# Patient Record
Sex: Female | Born: 1965 | Race: White | Hispanic: No | Marital: Married | Smoking: Former smoker
Health system: Southern US, Community
[De-identification: ages and names within clinical notes are randomized; demographics above are authoritative.]

## PROBLEM LIST (undated history)

## (undated) DIAGNOSIS — G709 Myoneural disorder, unspecified: Secondary | ICD-10-CM

## (undated) HISTORY — PX: LUMBAR DISC SURGERY: SHX700

## (undated) HISTORY — PX: ELBOW SURGERY: SHX618

## (undated) HISTORY — PX: ABDOMINOPLASTY: SUR9

## (undated) HISTORY — PX: APPENDECTOMY: SHX54

---

## 2007-09-10 ENCOUNTER — Encounter: Admission: RE | Admit: 2007-09-10 | Discharge: 2007-09-10 | Payer: Self-pay | Admitting: Neurosurgery

## 2007-12-12 ENCOUNTER — Encounter: Admission: RE | Admit: 2007-12-12 | Discharge: 2007-12-12 | Payer: Self-pay | Admitting: Obstetrics and Gynecology

## 2008-02-08 ENCOUNTER — Encounter: Admission: RE | Admit: 2008-02-08 | Discharge: 2008-04-16 | Payer: Self-pay | Admitting: Unknown Physician Specialty

## 2008-07-31 ENCOUNTER — Emergency Department (HOSPITAL_COMMUNITY): Admission: EM | Admit: 2008-07-31 | Discharge: 2008-07-31 | Payer: Self-pay | Admitting: Emergency Medicine

## 2008-12-16 ENCOUNTER — Encounter: Admission: RE | Admit: 2008-12-16 | Discharge: 2008-12-16 | Payer: Self-pay | Admitting: Obstetrics and Gynecology

## 2010-01-30 ENCOUNTER — Encounter: Admission: RE | Admit: 2010-01-30 | Discharge: 2010-01-30 | Payer: Self-pay | Admitting: Obstetrics and Gynecology

## 2010-08-13 ENCOUNTER — Other Ambulatory Visit: Payer: Self-pay | Admitting: Family Medicine

## 2010-08-13 ENCOUNTER — Ambulatory Visit
Admission: RE | Admit: 2010-08-13 | Discharge: 2010-08-13 | Disposition: A | Discharge status: Home / Self Care | Payer: Private Health Insurance - Indemnity | Source: Ambulatory Visit | Attending: Family Medicine | Admitting: Family Medicine

## 2010-08-13 MED ORDER — IOHEXOL 300 MG/ML  SOLN
100.0000 mL | Freq: Once | INTRAMUSCULAR | Status: AC | PRN
  Administered 2010-08-13: 100 mL via INTRAVENOUS

## 2010-08-14 ENCOUNTER — Other Ambulatory Visit: Payer: Self-pay | Admitting: Internal Medicine

## 2010-08-14 DIAGNOSIS — E039 Hypothyroidism, unspecified: Secondary | ICD-10-CM

## 2010-08-18 ENCOUNTER — Other Ambulatory Visit: Payer: Private Health Insurance - Indemnity

## 2010-08-20 ENCOUNTER — Other Ambulatory Visit: Payer: Self-pay | Admitting: Internal Medicine

## 2010-08-20 DIAGNOSIS — E282 Polycystic ovarian syndrome: Secondary | ICD-10-CM

## 2010-08-21 ENCOUNTER — Ambulatory Visit
Admission: RE | Admit: 2010-08-21 | Discharge: 2010-08-21 | Disposition: A | Discharge status: Home / Self Care | Payer: Private Health Insurance - Indemnity | Source: Ambulatory Visit | Attending: Internal Medicine | Admitting: Internal Medicine

## 2010-08-21 ENCOUNTER — Other Ambulatory Visit: Payer: Private Health Insurance - Indemnity

## 2010-08-21 DIAGNOSIS — E039 Hypothyroidism, unspecified: Secondary | ICD-10-CM

## 2010-08-25 ENCOUNTER — Inpatient Hospital Stay: Admission: RE | Admit: 2010-08-25 | Payer: Private Health Insurance - Indemnity | Source: Ambulatory Visit

## 2011-05-20 ENCOUNTER — Ambulatory Visit (INDEPENDENT_AMBULATORY_CARE_PROVIDER_SITE_OTHER): Payer: Private Health Insurance - Indemnity | Admitting: Sports Medicine

## 2011-05-20 VITALS — BP 100/60 | Ht 65.0 in | Wt 130.0 lb

## 2011-05-20 DIAGNOSIS — M7710 Lateral epicondylitis, unspecified elbow: Secondary | ICD-10-CM

## 2011-05-20 DIAGNOSIS — M7711 Lateral epicondylitis, right elbow: Secondary | ICD-10-CM | POA: Insufficient documentation

## 2011-05-20 DIAGNOSIS — M25529 Pain in unspecified elbow: Secondary | ICD-10-CM

## 2011-05-20 DIAGNOSIS — M25521 Pain in right elbow: Secondary | ICD-10-CM

## 2011-05-20 MED ORDER — NITROGLYCERIN 0.2 MG/HR TD PT24: MEDICATED_PATCH | TRANSDERMAL | Status: DC

## 2011-05-20 NOTE — Assessment & Plan Note (Signed)
use over-the-counter Aleve or Advil along with topical medicines as desired  Ice massage if needed  Heat if desired  Compression sleeve in the office gave her some real comfort and so we gave her one of those to use

## 2011-05-20 NOTE — Assessment & Plan Note (Signed)
Begin rehabilitation exercises as described  Start on nitroglycerin protocol and repeat her scan in one month

## 2011-05-20 NOTE — Patient Instructions (Signed)
Please do suggested exercises for elbow 3 sets of 15 daily  Start using 1/4 patch of nitroglycerin daily.    Please follow up in 1 month for rescan of the elbow   OK to do other exercises that don't hurt  Use compression sleeve for activity

## 2011-05-20 NOTE — Progress Notes (Signed)
  Subjective:    Patient ID: Stacy Terrell, female    DOB: 10-May-1966, 45 y.o.   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 Hurt RT elbow - prob on Rowing machine in Feb Back to tennis in [name] Could not play - changed strings/ went to 2 hand backhand  Dr Stacy Terrell in summer - June Had PT - 10 sessions 2 steroid shots - good pain relief - did not last though  Dried needling has not helped yet after 3 sessions  Now hurts all the time and even at night  Uses std braces - has tried 3  Tried all the creams including arnica and topicals/ oral NSAIDs - not much help either   Review of Systems Hx of fracture to left radial head    Objective:   Physical Exam  NAD  Has full ROM of RT and left elbow  Pain on resistance of wrist or finger extension  Cannot do book  TTP lat epidcondyle  MSK ultrasound Chest soft tissue disruption around the lateral epicondyle as evidenced by hypoechoic change. There is a small calcification at the tip of the epicondyle There is minor calcification in the extensor muscle tendon and an area of edematous change and possible separation from the bony attachment This is noted on both the longitudinal and transverse scans There is also marked increase in Doppler activity including neo-vessels      Assessment & Plan:

## 2011-06-17 ENCOUNTER — Encounter: Payer: Self-pay | Admitting: Sports Medicine

## 2011-06-17 ENCOUNTER — Ambulatory Visit (INDEPENDENT_AMBULATORY_CARE_PROVIDER_SITE_OTHER): Payer: Private Health Insurance - Indemnity | Admitting: Sports Medicine

## 2011-06-17 VITALS — BP 121/76 | HR 76

## 2011-06-17 DIAGNOSIS — M25521 Pain in right elbow: Secondary | ICD-10-CM

## 2011-06-17 DIAGNOSIS — M25529 Pain in unspecified elbow: Secondary | ICD-10-CM

## 2011-06-17 DIAGNOSIS — IMO0002 Reserved for concepts with insufficient information to code with codable children: Secondary | ICD-10-CM

## 2011-06-17 DIAGNOSIS — S86899A Other injury of other muscle(s) and tendon(s) at lower leg level, unspecified leg, initial encounter: Secondary | ICD-10-CM

## 2011-06-17 NOTE — Patient Instructions (Signed)
Keep custom orthotics. Abductors exercises. Heel excentric exercises( heel drops) Use the calf sleeves. Ice massage after running F/U 4-6 weeks

## 2011-06-17 NOTE — Progress Notes (Signed)
Subjective:    Patient ID: Stacy Terrell, female    DOB: 1965-12-14, 45 y.o.   MRN: [id number]  HPI  Stacy Terrell is coming today to follow up on her right elbow lateral epicondylitis. She has been using the nitroglycerin patch and doing the lateral epicondylitis stretches for about 4 weeks. She stated that she is 20% better, to state that her pain is much milder, one in intensity, on a none, is not a constant pain, no numbness or tingling, the pain is improved by resting and with the stretches.  She is also complaining of bilateral shin splints worse on the right side. She has been having shin splints for the last 2 years. She was seen by the foot and ankle specialist 2 years ago and an MRI of the right leg was done which apparently was negative for any stress fracture. She stated that her pain in both legs is constant worse on the right side 5/10 intensity, and the left side to 10 intensity. She has pain in her shins walking and running when she hit the 6 miles marks. No numbness no tingling the pain subsides with rest. She running every other day alternating with cross fit training She she had orthotics made 2 years ago which are rigid in our opinion. She has never done any calf stretching or strengthening exercises she has never tried to use any calf sleeves. She is not trying cryotherapy after activities either. She also has been diagnosed with a right foot Morton's neuroma reasonable she has metatarsal pads on both of her orthotics.  Patient Active Problem List  Diagnoses  . Elbow pain, right  . Lateral epicondylitis of right elbow   Current Outpatient Prescriptions on File Prior to Visit  Medication Sig Dispense Refill  . Calcium Carbonate-Vit D-Min (CALCIUM 1200 PO) Take 1 tablet by mouth daily.        . Iodine, Kelp, (KELP PO) Take 200 mg by mouth daily.        . Linoleic Acid Conjugated (CLA PO) Take 1,600 mg by mouth daily.        Marland Kitchen LOESTRIN 24 FE 1-20 MG-MCG tablet Take 1 tablet by  mouth daily.      . nitroGLYCERIN (NITRODUR - DOSED IN MG/24 HR) 0.2 mg/hr Apply 1/4 patch to affected area daily.  Change patch every 24 hours as directed.  30 patch  1  . spironolactone (ALDACTONE) 25 MG tablet Take 50 mg by mouth daily.      Marland Kitchen SYNTHROID 175 MCG tablet Take 175 mcg by mouth daily.       Allergies  Allergen Reactions  . Aspirin Nausea Only     Review of Systems  Constitutional: Negative for fever, chills, diaphoresis and fatigue.  Musculoskeletal: Negative for back pain, joint swelling and gait problem.  Neurological: Negative for tremors, weakness and numbness.       Objective:   Physical Exam  Constitutional: She appears well-developed and well-nourished.       BP 121/76  Pulse 76   Pulmonary/Chest: Effort normal.  Musculoskeletal:       Right elbow with intact skin. No swelling, no hematoma. Full Range of motion. Tender to palpation over the lateral epicondyle. Wrist extension against resistance reproduce lateral elbow pain. No instability. Mild click with extension Sensation intact distally.  Legs with tenderness to palpation in the medial portion of the distal third of the leg, and attachment of the gastroc and the Tibia bilateral worse in the right side compared  to the left. She is able to hop 10 times in both legs with pain at the end of the hoping test. Wound range of motion in the knee and ankle joint bilateral Neurovascularly intact Leg length discrepancy being the right leg shorter by 2 cm. Weak hip abductors bilateral . Gait independent without a limp. A feet with mild flat arch bilateral.  Neurological: She is alert.  Skin: Skin is warm. No rash noted. No erythema. No pallor.  Psychiatric: She has a normal mood and affect. Her behavior is normal.    MSK U/S of both tibias: Negative for stress fracture, negative for increasing vasculature, negative for periosteal reaction.      Assessment & Plan:   1. Elbow pain, right   2. Shin  splints    Keep temporary orthotics with sports insoles, metatarsal pads, and small scaphoid pads. Abductors exercises. Heel excentric exercises( heel drops) Use the calf sleeves. Ice massage after running F/U 4-6 weeks we will proceed with custom orthotics in that visit if she benefits from the temporary ones.

## 2011-06-17 NOTE — Progress Notes (Deleted)
Patient ID: Stacy Terrell, female   DOB: Jadine 24, 1967, 45 y.o.   MRN: [id number]

## 2011-07-21 ENCOUNTER — Ambulatory Visit (INDEPENDENT_AMBULATORY_CARE_PROVIDER_SITE_OTHER): Payer: Private Health Insurance - Indemnity | Admitting: Sports Medicine

## 2011-07-21 VITALS — BP 120/60

## 2011-07-21 DIAGNOSIS — M79609 Pain in unspecified limb: Secondary | ICD-10-CM

## 2011-07-21 DIAGNOSIS — M25529 Pain in unspecified elbow: Secondary | ICD-10-CM

## 2011-07-21 DIAGNOSIS — M7710 Lateral epicondylitis, unspecified elbow: Secondary | ICD-10-CM

## 2011-07-21 DIAGNOSIS — M79669 Pain in unspecified lower leg: Secondary | ICD-10-CM

## 2011-07-21 DIAGNOSIS — M25521 Pain in right elbow: Secondary | ICD-10-CM

## 2011-07-21 DIAGNOSIS — M7711 Lateral epicondylitis, right elbow: Secondary | ICD-10-CM

## 2011-07-21 NOTE — Assessment & Plan Note (Addendum)
This has been going on a long time but unlikely to be stress fracture as she can run long distance on it.  May be related to biomechanics and rigid orthotics.  Will try custom cushioned orthotics that are more appropriate for running.   Patient was fitted for a : standard, cushioned, semi-rigid orthotic. The orthotic was heated and afterward the patient stood on the orthotic blank positioned on the orthotic stand. The patient was positioned in subtalar neutral position and 10 degrees of ankle dorsiflexion in a weight bearing stance. After completion of molding, a stable base was applied to the orthotic blank. The blank was ground to a stable position for weight bearing. Size: 8 red cambray Base: blue EVA Posting: RT heel blue foarm Additional orthotic padding: MT soft pads on plantar surface as a test as opposed to typical MT pads Time 40 mins  She may use the rigid orthotics for standard walking during the day as these do help her neuroma. However following because of the persistent shin problems on her trunk discussion orthotic. We will need to recheck in a couple of months to see if the padding strategy we used will help keep pressure off the forefoot.

## 2011-07-21 NOTE — Assessment & Plan Note (Addendum)
Some improvement symptomatically.  Will continue Nitro patches and have advised increasing weight up to 3 lbs for exercises.  Advised not to try tennis yet.  Follow up in 2 months.   We will repeat her ultrasound to check for neovascular activity change which she had on her last scan on return visit

## 2011-07-21 NOTE — Assessment & Plan Note (Signed)
Pain is now at a reasonable level and we will not add anything but she is to continue her nitroglycerin

## 2011-07-21 NOTE — Progress Notes (Signed)
  Subjective:    Patient ID: Stacy Terrell, female    DOB: Apr 29, 1966, 46 y.o.   MRN: [id number]  HPI  Stacy Terrell come sin for follow up of her lateral epicondylitis and her shin pain.   She is doing exercises for her elbow with one pound weights.  She is using 1/4 of a nitro patch daily.  She says the pain is better, no longer constant, but will still hurt if she picks up something heavy with her left and (like a gallon of milk).  She has not tried to play tennis yet.   Her shins are still very painful, and she was not able to wear the sports insoles she got last time because they gave her blisters.  She ran 6.9 miles on Sunday, and the pain is not so bad while running, but she says it hurts badly for two days after.  She has rigid orthotics made by her podiatrist for the neuroma in her right foot.  These help tremendously with the neuroma, but do not help with shin pain.   Review of Systems Pertinent items in HPI.     Objective:   Physical Exam BP 120/60 General appearance: alert, cooperative and no distress MSK:  Left elbow with TTP on lateral epicondyle. Pt has pain with supination of left hand Pain with extension of left hand and fingers Pain with lifting heavy book with arm extended  Patient has normal strength and of hip adductors and abductors Right shin tender to palpation over area between mid and lower tibia  Gait: when running, right foot crosses midline in backswing of step Orthotics made with correction for unequal leg length Gait abnormality improved with orthotic.         Assessment & Plan:

## 2011-08-24 ENCOUNTER — Ambulatory Visit (INDEPENDENT_AMBULATORY_CARE_PROVIDER_SITE_OTHER): Payer: Private Health Insurance - Indemnity | Admitting: Sports Medicine

## 2011-08-24 VITALS — BP 110/70

## 2011-08-24 DIAGNOSIS — M79669 Pain in unspecified lower leg: Secondary | ICD-10-CM

## 2011-08-24 DIAGNOSIS — M25569 Pain in unspecified knee: Secondary | ICD-10-CM

## 2011-08-24 DIAGNOSIS — M79609 Pain in unspecified limb: Secondary | ICD-10-CM

## 2011-08-24 DIAGNOSIS — M25561 Pain in right knee: Secondary | ICD-10-CM | POA: Insufficient documentation

## 2011-08-24 DIAGNOSIS — M7711 Lateral epicondylitis, right elbow: Secondary | ICD-10-CM

## 2011-08-24 DIAGNOSIS — M7710 Lateral epicondylitis, unspecified elbow: Secondary | ICD-10-CM

## 2011-08-24 NOTE — Progress Notes (Signed)
  Subjective:    Patient ID: Stacy Terrell, female    DOB: 02/13/1966, 46 y.o.   MRN: [id number]  HPI  Pt presents to clinic for f/u of rt lat elbow pain which is 70% improved.   Using nitroglycerin patches daily.  Training for 1/2 marathon - 20-25 mpw.  Has rt knee and shin pain.   Right knee pain since to have increased some since we placed her in a cushioned orthotics with a lift on the right side. However her shin pain has decreased significantly with the additional cushioned. In addition the neuroma pain she has bilaterally is much less with the padding. The right knee pain is along the patellar tendon and the remainder of the knee feels stable.  Review of Systems     Objective:   Physical Exam No acute distress  Rt elbow exam: Slight pain with resisted finger extension Mild pain with book test after lifting No ttp now  rt knee exam: Full flexion and extension Slight crepitation  Neg mcmurray's  Good quad strength Weak hip abduction on rt  Lt hip abduction stronger than rt  MSK ultrasound The right elbow looks significantly improved on scanning. There is almost no Doppler activity now. The areas of hypoechoic change of essentially resolved. There is some scattered calcification noted.  The right patellar tendon looks normal with no hypoechoic change or abnormal fluid around the tendon itself.     Assessment & Plan:

## 2011-08-24 NOTE — Assessment & Plan Note (Signed)
She has had dramatic improvement in her right elbow pain. Scanning also looks much better. We will continue the nitroglycerin and recheck her in 2 months. She can start trying some easy tennis activities. Continue the elbow exercises with low weight since more than 2 pounds is painful to her wrist.

## 2011-08-24 NOTE — Assessment & Plan Note (Signed)
This is much improved after addition of custom orthotics that are cushioned

## 2011-08-24 NOTE — Assessment & Plan Note (Signed)
We removed the lift on the right orthotic. She will work on her hip strength which is significantly weak and may be affecting this. She will try a patellar strap until seeing [place] in 2 months.

## 2011-08-24 NOTE — Patient Instructions (Signed)
Please continue using 1/4 nitroglycerin patch on right elbow daily   You can start hitting tennis strokes for the next 2 weeks, if no problems with this it is ok to start playing tennis  Do side leg lifts to work on hip strength  Please follow up in 2 months  Thank you for seeing [place] today!

## 2011-10-25 ENCOUNTER — Ambulatory Visit: Payer: Private Health Insurance - Indemnity | Admitting: Sports Medicine

## 2011-10-27 ENCOUNTER — Ambulatory Visit: Payer: Private Health Insurance - Indemnity | Admitting: Sports Medicine

## 2011-11-11 ENCOUNTER — Ambulatory Visit: Payer: Private Health Insurance - Indemnity | Admitting: Sports Medicine

## 2011-11-24 ENCOUNTER — Encounter: Payer: Self-pay | Admitting: Sports Medicine

## 2011-11-24 ENCOUNTER — Ambulatory Visit (INDEPENDENT_AMBULATORY_CARE_PROVIDER_SITE_OTHER): Payer: Private Health Insurance - Indemnity | Admitting: Sports Medicine

## 2011-11-24 VITALS — BP 110/73 | HR 76 | Ht 65.0 in | Wt 130.0 lb

## 2011-11-24 DIAGNOSIS — M7711 Lateral epicondylitis, right elbow: Secondary | ICD-10-CM

## 2011-11-24 DIAGNOSIS — M25529 Pain in unspecified elbow: Secondary | ICD-10-CM

## 2011-11-24 DIAGNOSIS — M7710 Lateral epicondylitis, unspecified elbow: Secondary | ICD-10-CM

## 2011-11-24 DIAGNOSIS — M79609 Pain in unspecified limb: Secondary | ICD-10-CM

## 2011-11-24 DIAGNOSIS — M25521 Pain in right elbow: Secondary | ICD-10-CM

## 2011-11-24 DIAGNOSIS — M25559 Pain in unspecified hip: Secondary | ICD-10-CM | POA: Insufficient documentation

## 2011-11-24 DIAGNOSIS — M79669 Pain in unspecified lower leg: Secondary | ICD-10-CM

## 2011-11-24 DIAGNOSIS — M25552 Pain in left hip: Secondary | ICD-10-CM

## 2011-11-24 MED ORDER — MELOXICAM 15 MG PO TABS: 15.0000 mg | ORAL_TABLET | Freq: Every day | ORAL | Status: DC

## 2011-11-24 MED ORDER — AMITRIPTYLINE HCL 25 MG PO TABS: 25.0000 mg | ORAL_TABLET | Freq: Every day | ORAL | Status: DC

## 2011-11-24 NOTE — Assessment & Plan Note (Signed)
Clinically this has resolved. °

## 2011-11-24 NOTE — Assessment & Plan Note (Signed)
Likely secondary to her degenerative disc disease. Concern for flare of nerve pain. Will try her on amitriptyline 25 mg each bedtime as well as Mobic daily to help with pain and inflammation. Will start hip exercises to continue strength building.

## 2011-11-24 NOTE — Assessment & Plan Note (Signed)
Continued pain in the shin this may be due to some imbalance in strike pattern with her hip weakness. We'll work on hip and then have her come back in 4-6 weeks to see about pain in the shins.

## 2011-11-24 NOTE — Patient Instructions (Signed)
Please take the meloxicam instead of the Aleve for now If you have stomach irritation with it, please call us and we will change your medicine Take the meloxicam with food  Please take the amitriptyline each night Let us know if you're too tired in the morning This should help with the nerve  Please come back and see [place] in 4-6 weeks to see how you're doing

## 2011-11-24 NOTE — Progress Notes (Signed)
Patient ID: Stacy Terrell, female   DOB: 11/09/1965, 46 y.o.   MRN: [id number] History of present illness: Right elbow-patient notes that right elbow pain is completely gone. She continues to wear a compression sleeve which helps her. She has stopped the nitroglycerin. She is playing tennis several times a week. She has learned how to use both hands for her forehand swing which helps with pressure.  Shin pain-right greater than left. She continues to ice and use compression sleeves. She has used the orthotics which are comfortable with not very helpful. She continues to run 20-24 miles per week next several the last 2 weeks her she hasn't been running. She notes that overall her shin splints are the same and not improved  Hip pain-patient stopped running because on her last run 2 weeks ago she noticed that afterwards she had a left-sided hip pain. This has run course that is not  particular hilly, she did not have new shoes, it was about 7 miles which is a typical run for her. She says that the pain started about 2 hours after run. She has her in Motrin without much relief and has tried Aleve with some mild relief.  Patient notes that she has a history of degenerative disc disease and had a back surgery several years ago. She feels that this has flared some and she has some numbness going down the left leg.  Physical exam: Hip- Full range of motion bilaterally without pain Negative Faber bilaterally Strength testing--patient is weaker on the left side in the hip flexor, sartorius, hip abductor, hip adductor Normal alignment of hips, knees, feet  Feet-posterior tibialis function is good Foot alignment is good Mild loss of transverse arch on left

## 2011-11-24 NOTE — Assessment & Plan Note (Signed)
Improved. Patient to continue her modified tennis play

## 2011-12-02 ENCOUNTER — Telehealth: Payer: Self-pay | Admitting: *Deleted

## 2011-12-02 NOTE — Telephone Encounter (Signed)
Message copied by Mora Bellman on Thu Dec 02, 2011  2:14 PM ------      Message from: CERESI, [place] L      Created: Thu Dec 02, 2011  8:33 AM      Regarding: phone message      Contact: [phone]       Pt called and states she is still having hip pain, she is taking 3 aleve a day that isn't helping, she wants to know if Dr. Darrick Penna thinks a cortisone patch would help her?

## 2011-12-02 NOTE — Telephone Encounter (Signed)
Called pt, left VM to return my call

## 2011-12-07 NOTE — Telephone Encounter (Signed)
Per Dr. Darrick Penna pt's hip pain is referred from her back, and prednisone does not generally help this if the back problem has persisted longer than 1 yr. Pt is taking amitriptyline 25 mg qhs with no problems x2 weeks.  Dr. Darrick Penna advised her to increase to amitriptyline 50 mg qhs, and ok to take aleve 2 tabs bid.  Pt agreeable, will let us know how she does with this.

## 2011-12-07 NOTE — Telephone Encounter (Signed)
Pt states hip is still as painful as it had been previously.  Meloxicam not helpful at all, aleve slightly better.  Pt wondering if prednisone pack would be a good option?

## 2011-12-17 ENCOUNTER — Other Ambulatory Visit: Payer: Self-pay | Admitting: Neurosurgery

## 2011-12-17 DIAGNOSIS — M542 Cervicalgia: Secondary | ICD-10-CM

## 2011-12-17 DIAGNOSIS — M545 Low back pain: Secondary | ICD-10-CM

## 2011-12-22 ENCOUNTER — Ambulatory Visit
Admission: RE | Admit: 2011-12-22 | Discharge: 2011-12-22 | Disposition: A | Discharge status: Home / Self Care | Payer: Private Health Insurance - Indemnity | Source: Ambulatory Visit | Attending: Neurosurgery | Admitting: Neurosurgery

## 2011-12-22 DIAGNOSIS — M545 Low back pain: Secondary | ICD-10-CM

## 2011-12-22 DIAGNOSIS — M542 Cervicalgia: Secondary | ICD-10-CM

## 2011-12-22 MED ORDER — GADOBENATE DIMEGLUMINE 529 MG/ML IV SOLN
12.0000 mL | Freq: Once | INTRAVENOUS | Status: AC | PRN
  Administered 2011-12-22: 12 mL via INTRAVENOUS

## 2011-12-23 ENCOUNTER — Ambulatory Visit: Payer: Private Health Insurance - Indemnity | Admitting: Sports Medicine

## 2012-01-19 ENCOUNTER — Ambulatory Visit (INDEPENDENT_AMBULATORY_CARE_PROVIDER_SITE_OTHER): Payer: Private Health Insurance - Indemnity | Admitting: Sports Medicine

## 2012-01-19 VITALS — BP 120/84

## 2012-01-19 DIAGNOSIS — M25559 Pain in unspecified hip: Secondary | ICD-10-CM

## 2012-01-19 MED ORDER — DICLOFENAC SODIUM 75 MG PO TBEC: 75.0000 mg | DELAYED_RELEASE_TABLET | Freq: Two times a day (BID) | ORAL | Status: DC

## 2012-01-19 MED ORDER — AMITRIPTYLINE HCL 25 MG PO TABS: 50.0000 mg | ORAL_TABLET | Freq: Every day | ORAL | Status: DC

## 2012-01-19 MED ORDER — PREDNISONE 50 MG PO TABS: ORAL_TABLET | ORAL | Status: AC

## 2012-01-19 NOTE — Patient Instructions (Addendum)
Very nice to meet you. I do think your hip pain is coming from your back. I think we should try to decrease any inflammation we can with a prednisone burst. Take 50 mg daily for the next 5 days. Because the meloxicam has not helped we're going to try a new anti-inflammatory that he take twice a day. Stop the Aleve. We will increase her amitriptyline to 50 mg at night. Use the muscle relaxer as needed. Continue the exercises and stretching. If you notice anything that seems to be off kilt please come back for manipulation but I did not see anything today that that would be an improvement. I think with your pain at this time he should consider a potential epidural steroid injection. Please come back if there is anything else we can do.

## 2012-01-19 NOTE — Assessment & Plan Note (Addendum)
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 Patient's MRI does show a potential nerve impingement that corresponds with patient's clinical symptoms. Attempted to find any malalignment that could be corrected with manipulation but there was none today. We'll attempt to treat with prednisone burst to try decrease in inflammation, also will try another type of anti-inflammatory, increase patient's amitriptyline to 50 mg at night Encourage patient to build up hip abductor muscle strength Patient will followup at neuro surgeon for the potential for epidural steroid injections. (Dr Gerlene Fee) Patient will return here if at any point thinks manipulative medicine could be beneficial.  We will follow up other probs as needed - those are doing better

## 2012-01-19 NOTE — Progress Notes (Signed)
Patient ID: Stacy Terrell, female   DOB: 1965-07-29, 46 y.o.   MRN: [id number]   Hip pain- patient is here for followup. This is left-sided and since that time we've seen her she hasn't been wearing her insoles, in addition to taking amitriptyline on a nightly basis, as well as meloxicam with no improvement. Patient states that the pain seems to be in the anterior of the deep part of her hip with some radiation down to the bottom of her foot. Patient does have a past medical history significant for herniated disc repair of L5-S1. Patient does have some postoperative changes at L5-S1 with minimal scarring around the left side of the thecal sac on MRI that was taken on December 17, 2011. Patient denies any weakness but has been going to physical therapy where the therapist told her that she needs to stop all activities which is not option for the patient.    Physical exam: Hip- Full range of motion bilaterally without pain Negative Faber bilaterally Strength testing--patient is weaker on the left side in the hip flexor, sartorius, hip abductor, hip adductor 3+ out of 5 strength compared to 4/5 strength on the opposite side. Normal alignment of hips, knees, feet Deep tendon reflexes 2+ and symmetric. Neurovascularly intact distally  Feet-posterior tibialis function is good Foot alignment is good Mild loss of transverse arch on left

## 2012-02-15 ENCOUNTER — Ambulatory Visit (INDEPENDENT_AMBULATORY_CARE_PROVIDER_SITE_OTHER): Payer: Private Health Insurance - Indemnity | Admitting: Family Medicine

## 2012-02-15 VITALS — BP 100/60

## 2012-02-15 DIAGNOSIS — M999 Biomechanical lesion, unspecified: Secondary | ICD-10-CM | POA: Insufficient documentation

## 2012-02-15 DIAGNOSIS — M25559 Pain in unspecified hip: Secondary | ICD-10-CM

## 2012-02-15 MED ORDER — AMITRIPTYLINE HCL 25 MG PO TABS: 50.0000 mg | ORAL_TABLET | Freq: Every day | ORAL | Status: DC

## 2012-02-15 MED ORDER — CYCLOBENZAPRINE HCL 10 MG PO TABS: 10.0000 mg | ORAL_TABLET | ORAL | Status: DC | PRN

## 2012-02-15 NOTE — Assessment & Plan Note (Signed)
Decision today to treat with OMT was based on Physical Exam  After verbal consent patient was treated with HVLA and ME techniques in sacrum and illium areas  Patient tolerated the procedure well with improvement in symptoms  Patient given exercises, stretches and lifestyle modifications  See medications in patient instructions if given  Patient will follow up in 3-6 weeks

## 2012-02-15 NOTE — Assessment & Plan Note (Signed)
Patient's hip pain improves somewhat with her prednisone burst as well as exercising but still not at the point where she is satisfied. Patient has MR results that show that there is a potential for nerve impingement causing her symptoms. I do agree that an epidural injection could be of benefit in this patient. That said patient did have osteopathic manipulation done today and that she had some significant improvement. Patient will probably followup again in 3-6 weeks for further evaluation and treatment. Patient given exercises and stretches and encouraged to do them on a daily basis.

## 2012-02-15 NOTE — Patient Instructions (Signed)
I think it is a good idea to get the injection. Please come back if you need any other manipulation.  Usually that sometimes is between 3-6 weeks.

## 2012-02-15 NOTE — Progress Notes (Signed)
Patient ID: Stacy Terrell, female   DOB: [birth year], 46 y.o.   MRN: [id number]  Patient is here for followup of her hip pain. Patient at last visit was given a prednisone burst as well as increase her amitriptyline. Patient states that this has helped somewhat. Patient states though over the course of the last 3 or 4 days she started having worsening pain in her left hip again. Patient has already made her an appointment to have her second epidural injection. Patient still states that she has some radiation of the pain down to her left leg as well as her left foot sometimes. Patient also feels like she is weak on the side of the body. Patient does still run 3.2 miles and can complete this but does have significant pain afterwards. Patient is also been trying to do the exercises and stretching at home with minimal improvement.     Patient does have a past medical history significant for herniated disc repair of L5-S1. Patient does have some postoperative changes at L5-S1 with minimal scarring around the left side of the thecal sac on MRI that was taken on December 17, 2011.      Physical exam: Hip- Full range of motion bilaterally without pain Negative Faber bilaterally Strength testing--patient is weaker on the left side in the hip flexor, sartorius, hip abductor, hip adductor 3+ out of 5 strength compared to 4/5 strength on the opposite side. Normal alignment of hips, knees, feet Deep tendon reflexes 2+ and symmetric. Neurovascularly intact distally  OMT Physical Exam  Standing flexion  left Seated Flexion right Cervical  Not examined  Thoracic Not examined Lumbar L2-3 flexed rotated and side bent right.  Sacrum Right on right   Illium  Left anterior illium

## 2012-02-21 ENCOUNTER — Other Ambulatory Visit: Payer: Self-pay | Admitting: Obstetrics and Gynecology

## 2012-02-21 DIAGNOSIS — Z1231 Encounter for screening mammogram for malignant neoplasm of breast: Secondary | ICD-10-CM

## 2012-03-07 ENCOUNTER — Ambulatory Visit (INDEPENDENT_AMBULATORY_CARE_PROVIDER_SITE_OTHER): Payer: Private Health Insurance - Indemnity | Admitting: Family Medicine

## 2012-03-07 VITALS — BP 114/70

## 2012-03-07 DIAGNOSIS — M999 Biomechanical lesion, unspecified: Secondary | ICD-10-CM

## 2012-03-07 NOTE — Progress Notes (Signed)
Patient ID: Stacy Terrell, female   DOB: [birth year], 46 y.o.   MRN: [id number]  Patient is here for followup of her hip pain. Patient states she has been doing very well overall but still has the chronic back pain and radiation of the hip.  Patient is coming here for manipulation.  Patient is still training for a triathalon with her two different triathalon coaches.  Patient has had epidural injections before for her back and will be having it again 2 days after the marathon on Sept 16th. Patient still states that she has some radiation of the pain down to her left leg as well as her left foot sometimes. Patient does still run 3.2 miles and can complete this but does have significant pain afterwards. Patient is also been trying to do the exercises and stretching at home with minimal improvement.     Patient does have a past medical history significant for herniated disc repair of L5-S1. Patient does have some postoperative changes at L5-S1 with minimal scarring around the left side of the thecal sac on MRI that was taken on December 17, 2011.    Physical exam: Filed Vitals:   03/07/12 1629  BP: 114/70  Gen NAD A&Ox3 mood normal healthy female  Hip- Full range of motion bilaterally without pain Negative Faber bilaterally Strength testing--patient is weaker on the left side in the hip flexor, sartorius, hip abductor, hip adductor 3+ out of 5 strength compared to 4/5 strength on the opposite side. Normal alignment of hips, knees, feet Deep tendon reflexes 2+ and symmetric. Neurovascularly intact distally  OMT Physical Exam  Standing flexion  left Seated Flexion right Cervical  Not examined  Thoracic T5 E RS right Lumbar L2-3 flexed rotated and side bent right.  Sacrum Right on right   Illium  Left anterior illium

## 2012-03-07 NOTE — Assessment & Plan Note (Signed)
Decision today to treat with OMT was based on Physical Exam  After verbal consent patient was treated with HVLA ME techniques in lumbar and sacral areas  Patient tolerated the procedure well with improvement in symptoms  Patient given exercises, stretches and lifestyle modifications  See medications in patient instructions if given  Patient will follow up in 4 weeks

## 2012-04-18 ENCOUNTER — Ambulatory Visit: Payer: Private Health Insurance - Indemnity | Admitting: Family Medicine

## 2012-05-09 ENCOUNTER — Ambulatory Visit (INDEPENDENT_AMBULATORY_CARE_PROVIDER_SITE_OTHER): Payer: Private Health Insurance - Indemnity | Admitting: Family Medicine

## 2012-05-09 ENCOUNTER — Encounter: Payer: Self-pay | Admitting: Family Medicine

## 2012-05-09 VITALS — BP 117/82 | HR 76

## 2012-05-09 DIAGNOSIS — M9981 Other biomechanical lesions of cervical region: Secondary | ICD-10-CM

## 2012-05-09 DIAGNOSIS — M999 Biomechanical lesion, unspecified: Secondary | ICD-10-CM | POA: Insufficient documentation

## 2012-05-09 NOTE — Assessment & Plan Note (Signed)
Decision today to treat with OMT was based on Physical Exam  After verbal consent patient was treated with ME and articular techniques in cervical and thoracic areas  Patient tolerated the procedure well with improvement in symptoms  Patient given exercises, stretches and lifestyle modifications  See medications in patient instructions if given  Patient will follow up in 4-6 weeks

## 2012-05-09 NOTE — Progress Notes (Signed)
Subjective: Patient's actually returns today to tell me that her low back pain has completely resolved after 2 epidural injections. She is feeling much better at this time able to do all activities without any back pain. Patient continues to do the exercises that she was shown previously. Patient feels that she does not like any manipulation for this today.  Patient though does have a history of chronic cervical neck pain. Patient is having more recently and likely she says she's noticed anymore and other back does not hurt as much. Patient does have a cervical neck MRIback in June 2013 that showed stable left-sided spurring changes and a shallow disc protrusion at C4-5 with mild left foraminal stenosis. She also had degenerative disc disease at C5-C6. Patient states that the pain is mostly at the base of the neck on the right side. Patient denies any radiation but did have a history of numbness in the left upper tremor he previously. Patient denies any weakness any fevers or chills any dizziness or loss of strength of the upper extremities.  Objective: Blood pressure 117/82, pulse 76, last menstrual period 05/03/2012. General: No apparent distress alert and oriented x3 mood and affect normal OMT Physical Exam  Cervical  C3 RS left Elevated first rib on right with T1 rotated and side the right  Thoracic As above T5 extended rotated and side bent right  Lumbar Neutral Sacrum neutral Illium neutral

## 2012-06-20 ENCOUNTER — Ambulatory Visit (INDEPENDENT_AMBULATORY_CARE_PROVIDER_SITE_OTHER): Payer: Private Health Insurance - Indemnity | Admitting: Family Medicine

## 2012-06-20 VITALS — BP 110/70 | Ht 65.0 in | Wt 120.0 lb

## 2012-06-20 DIAGNOSIS — M9981 Other biomechanical lesions of cervical region: Secondary | ICD-10-CM

## 2012-06-20 DIAGNOSIS — M999 Biomechanical lesion, unspecified: Secondary | ICD-10-CM

## 2012-06-20 NOTE — Progress Notes (Signed)
Subjective: patient is here for followup of her chronic cervical neck pain. Patient states that it is better than previous exam. Patient has been doing some exercises but not as much as she should getting ready for the holidays. Patient has had some increasing stress of late as well. Patient has not had any new injury. Patient denies any radiation down the arm or into her hand.  Patient does have a cervical neck MRI back in June 2013 that showed stable left-sided spurring changes and a shallow disc protrusion at C4-5 with mild left foraminal stenosis. She also had degenerative disc disease at C5-C6.   Objective: Blood pressure 110/70, height 5\' 5"  (1.651 m), weight 120 lb (54.432 kg). General: No apparent distress alert and oriented x3 mood and affect normal  OMT Physical Exam  Cervical  C3 RS left C5 flexed rotated and side bent right Elevated first rib on right with T1 rotated and side the right  Thoracic As above T7 extended rotated and side bent right  Lumbar Neutral Sacrum Right on right Illium neutral

## 2012-06-20 NOTE — Assessment & Plan Note (Signed)
Decision today to treat with OMT was based on Physical Exam  After verbal consent patient was treated with HVLA and ME techniques in cervical and thoraci areas  Patient tolerated the procedure well with improvement in symptoms  Patient given exercises, stretches and lifestyle modifications  See medications in patient instructions if given  Patient will follow up in 6 weeks  Patient also had her TMJ manipulated and did have some mild improvement. Patient was taught home exercises as well.

## 2012-06-21 ENCOUNTER — Other Ambulatory Visit: Payer: Self-pay | Admitting: Family Medicine

## 2012-07-13 ENCOUNTER — Encounter: Payer: Self-pay | Admitting: Sports Medicine

## 2012-07-13 ENCOUNTER — Ambulatory Visit (INDEPENDENT_AMBULATORY_CARE_PROVIDER_SITE_OTHER): Payer: BC Managed Care – PPO | Admitting: Sports Medicine

## 2012-07-13 VITALS — BP 116/77 | Ht 65.0 in | Wt 120.0 lb

## 2012-07-13 DIAGNOSIS — M25559 Pain in unspecified hip: Secondary | ICD-10-CM

## 2012-07-13 DIAGNOSIS — R102 Pelvic and perineal pain: Secondary | ICD-10-CM

## 2012-07-13 NOTE — Assessment & Plan Note (Signed)
Patient has pain in her pubic symphysis with right hip strength testing. Additionally she has a weak right hip musculature.  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  Plan: AP x-ray pelvis Hip strengthening exercises Followup in 6 weeks

## 2012-07-13 NOTE — Progress Notes (Signed)
Stacy Terrell is a 47 y.o. female who presents to [facility] today for pelvis pain.  Patient presents because her triathlon season will start soon and she wants to get several small issues check out.  1) orthotics: Patient would like to ensure that her orthotics which were made almost a year ago are still in good condition and she does not require a second pair. She does not have significant foot or knee pain and is doing well. She runs about 25 miles per week.   2) pelvis pain: Patient notes occasional pelvis in her pubic symphysis especially with resisted hip abduction and adduction.  She denies significant pain with running. She denies any radiating pain weakness numbness fevers chills abdominal pain nausea vomiting diarrhea or vaginal discharge.  She notes that she has significant back and pelvic pain during her pregnancies 6 and 8 years ago.  She has not had a formal diagnosis.     PMH reviewed. Healthy very active History  Substance Use Topics  . Smoking status: Former Smoker    Quit date: 07/05/2002  . Smokeless tobacco: Never Used  . Alcohol Use: Not on file   ROS as above otherwise neg   Exam:  BP 116/77  Ht 5\' 5"  (1.651 m)  Wt 120 lb (54.432 kg)  BMI 19.97 kg/m2 Gen: Well NAD MSK: Pelvis: Mildly tender over the pubic symphysis Hip: Normal hip range of motion bilaterally Strength: Hip abduction and adduction on right are 4/5 compared to 5/5 on left. Bilateral hip strength testing causes pain at the pubic symphysis.  Knee: Normal-appearing knees bilaterally normal range of motion nontender negative Lachman's McMurray's valgus varus stress.  Gait: Mild dynamic right genu valgus.   Orthotic evaluation: Orthotics appear to be in good condition with no significant breakdown

## 2012-07-13 NOTE — Patient Instructions (Addendum)
Thank you for coming in today. We will get an xray of the pelvis to evaluate for osteitis pubis.   Hip abduction strength.  1) Side leg raise   30 reps 2 sets daily (without weight) 15-20 reps with 2 lb ankle weights.  2) Front step ups   15-30 reps 2 sets daily 3) Side step ups  15-30 reps 2 sets daily 4) Cross over step ups 15-30 reps 2 sets daily  GO slow with the step drills.   The orthotics look OK.   Return to clinic in 6 weeks.

## 2012-07-25 ENCOUNTER — Ambulatory Visit (INDEPENDENT_AMBULATORY_CARE_PROVIDER_SITE_OTHER): Payer: BC Managed Care – PPO | Admitting: Family Medicine

## 2012-07-25 VITALS — BP 100/62 | Ht 65.0 in | Wt 120.0 lb

## 2012-07-25 DIAGNOSIS — M9981 Other biomechanical lesions of cervical region: Secondary | ICD-10-CM

## 2012-07-25 DIAGNOSIS — M999 Biomechanical lesion, unspecified: Secondary | ICD-10-CM

## 2012-07-25 DIAGNOSIS — K668 Other specified disorders of peritoneum: Secondary | ICD-10-CM

## 2012-07-25 DIAGNOSIS — IMO0002 Reserved for concepts with insufficient information to code with codable children: Secondary | ICD-10-CM

## 2012-07-25 NOTE — Progress Notes (Signed)
Subjective: patient is here for followup of her chronic cervical neck pain. Patient states that it is better than previous exam.  Patient has had some increasing stress of late as well. Patient has not had any new injury. Patient denies any radiation down the arm or into her hand. Patient still complains of this mostly right-sided in the base of her neck. Denies numbness in her extremity.  Patient does have a cervical neck MRI back in June 2013 that showed stable left-sided spurring changes and a shallow disc protrusion at C4-5 with mild left foraminal stenosis. She also had degenerative disc disease at C5-C6.   Patient is also been seen for TMJ previously and was given some manipulation to do at home which he forgot and would like to discuss again. Patient states she still is having chronic pain mostly on the right side.  Patient is also complaining of a bulge that is in her C-section scar. Patient states that this popped up couple weeks ago. Patient states that it has not changed in his diet and does not hurt. Patient denies any redness. Patient denies any significant bowel movement changes but does state that she has some mild increase in constipation. Once again no pain. Patient also denies any nausea or vomiting.  Objective: Blood pressure 100/62, height 5\' 5"  (1.651 m), weight 120 lb (54.432 kg). General: No apparent distress alert and oriented x3 mood and affect normal Abdominal exam: She has a healed C-section scar but does have an area of mild fluctuance approximately 0.25 cm in diameter just to the right of her belly button. This is to be nontender and patient bears down it does not change in size.   OMT Physical Exam  Cervical  C3 RS left C5 flexed rotated and side bent right Elevated first rib on right with T1 rotated and side the right  Thoracic As above T7 extended rotated and side bent right  Lumbar Neutral Sacrum Right on right Illium neutral

## 2012-07-25 NOTE — Assessment & Plan Note (Signed)
Decision today to treat with OMT was based on Physical Exam  After verbal consent patient was treated with HVLA and ME techniques in cervical and thoraci areas  Patient tolerated the procedure well with improvement in symptoms  Patient given exercises, stretches and lifestyle modifications  See medications in patient instructions if given  Patient will follow up in 6 weeks  Patient also had her TMJ manipulated and did have some mild improvement. Patient was taught home exercises again

## 2012-07-25 NOTE — Assessment & Plan Note (Signed)
Patient has what appears to be an abdominal cyst is fluid-filled under muscle skeletal ultrasound which was performed and interpreted by me today. This cyst has no communication to underlying features and only is in the subcutaneous tissue. There is no communication with the abdominal musculature or bowel. This has no neovascularization and when patient bears down or increases her intra-abdominal pressure there is no significant change in size. We'll continue to monitor until patient becomes symptomatic with either pain or enlargement of the area. Patient is able to do any of the activity she would like.

## 2012-08-09 ENCOUNTER — Other Ambulatory Visit: Payer: Self-pay | Admitting: Family Medicine

## 2012-08-17 ENCOUNTER — Other Ambulatory Visit: Payer: Self-pay | Admitting: Family Medicine

## 2012-08-29 ENCOUNTER — Ambulatory Visit: Payer: BC Managed Care – PPO | Admitting: Family Medicine

## 2012-09-19 ENCOUNTER — Ambulatory Visit (INDEPENDENT_AMBULATORY_CARE_PROVIDER_SITE_OTHER): Payer: BC Managed Care – PPO | Admitting: Family Medicine

## 2012-09-19 VITALS — BP 90/60 | Ht 65.0 in | Wt 122.0 lb

## 2012-09-19 DIAGNOSIS — M999 Biomechanical lesion, unspecified: Secondary | ICD-10-CM

## 2012-09-19 DIAGNOSIS — M7711 Lateral epicondylitis, right elbow: Secondary | ICD-10-CM

## 2012-09-19 DIAGNOSIS — M9981 Other biomechanical lesions of cervical region: Secondary | ICD-10-CM

## 2012-09-19 DIAGNOSIS — M7710 Lateral epicondylitis, unspecified elbow: Secondary | ICD-10-CM

## 2012-09-19 MED ORDER — DICLOFENAC SODIUM 1 % TD GEL: 2.0000 g | Freq: Two times a day (BID) | TRANSDERMAL | Status: DC

## 2012-09-19 NOTE — Assessment & Plan Note (Signed)
Decision today to treat with OMT was based on Physical Exam  After verbal consent patient was treated with HVLA, ME techniques in cervical areas  Patient tolerated the procedure well with improvement in symptoms  Patient given exercises, stretches and lifestyle modifications  See medications in patient instructions if given  Patient will follow up in 6-8 weeks, warned we are having trouble scheduling at this time.

## 2012-09-19 NOTE — Assessment & Plan Note (Signed)
Decision today to treat with OMT was based on Physical Exam  After verbal consent patient was treated with HVLA and ME techniques in cervical, lumbar, and sacral areas  Patient tolerated the procedure well with improvement in symptoms  Patient given exercises, stretches and lifestyle modifications  See medications in patient instructions if given  Patient will follow up in 6-8 weeks

## 2012-09-19 NOTE — Assessment & Plan Note (Signed)
The patient is somewhat starting to worsen again. Patient does have a past medical history significant for a right radial head fracture status post ORIF. We'll continue to monitor. Patient knows all the exercises and stretches and we'll start these. If for some reason it seems to be worse in the next 6-8 weeks she will come back again and we will consider doing another ultrasound at that time.

## 2012-09-19 NOTE — Patient Instructions (Signed)
Try voltaren gel again. Lets see if it helps your jaw.  Come see me again in 6-8 weeks if your tennis elbow is not better.  Good to see you.

## 2012-09-19 NOTE — Progress Notes (Signed)
History of present illness: Patient is here for followup of her chronic cervical neck pain. Patient has been doing very well but she has started swimming again which is causing increasing pain. Patient does have a past medical history significant for a cervical neck MRI that did show some left-sided spurring changes and a shallow disc protrusion at the C4-5 with mild left foraminal stenosis. Patient overall states that she continues to improve. Patient denies any radiculopathy or any weakness in the upper 20s.  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  Patient has seen multiple specialists and continues to not have to improvement. Patient has had achiness and pain on the right side without any radiculopathy. Patient has tried different things such as mouth guards and has been doing the exercises but does not know she's been doing the right. Patient denies any fevers or chills he denies any weakness with chewing. Patient is cardiac teeth that she knows of.  Patient is having some mild tennis elbow for left elbow 2. She is starting to get ready for tennis and is concerned that she started having pain. Patient has a past medical history significant for a radial head fracture previously. Denies any radiation of pain denies any numbness tingling or any loss of sensation in the upper extremities  Physical exam Blood pressure 90/60, height 5\' 5"  (1.651 m), weight 122 lb (55.339 kg). General: No apparent distress alert and oriented x3 mood and affect normal. Left elbow exam: On inspection no gross deformity. Patient's incision is well-healed with no erythema. Patient has full range of motion but is tender over the lateral epicondyle. Patient does have some mild pain with resisted extension of the middle finger.   OMT Physical Exam  Cervical  C3 RS left  C5 flexed rotated and side bent right  Elevated first rib on right with T1 rotated and side the right  Thoracic  As above  T7 extended rotated and  side bent right  Lumbar  Neutral  Sacrum  Right on right  Illium  neutral

## 2012-10-31 ENCOUNTER — Encounter: Payer: Self-pay | Admitting: Family Medicine

## 2012-10-31 ENCOUNTER — Ambulatory Visit (INDEPENDENT_AMBULATORY_CARE_PROVIDER_SITE_OTHER): Payer: BC Managed Care – PPO | Admitting: Family Medicine

## 2012-10-31 VITALS — BP 102/73 | Ht 65.0 in | Wt 122.0 lb

## 2012-10-31 DIAGNOSIS — M9981 Other biomechanical lesions of cervical region: Secondary | ICD-10-CM

## 2012-10-31 DIAGNOSIS — M999 Biomechanical lesion, unspecified: Secondary | ICD-10-CM

## 2012-10-31 NOTE — Assessment & Plan Note (Signed)
Decision today to treat with OMT was based on Physical Exam  After verbal consent patient was treated with HVLA and ME techniques in cervical, thoracic and sacral areas  Patient tolerated the procedure well with improvement in symptoms  Patient given exercises, stretches and lifestyle modifications  See medications in patient instructions if given  Patient will follow up in 6 weeks  

## 2012-10-31 NOTE — Assessment & Plan Note (Signed)
Decision today to treat with OMT was based on Physical Exam  After verbal consent patient was treated with HVLA and ME techniques in cervical, thoracic and sacral areas  Patient tolerated the procedure well with improvement in symptoms  Patient given exercises, stretches and lifestyle modifications  See medications in patient instructions if given  Patient will follow up in 6 weeks

## 2012-10-31 NOTE — Progress Notes (Signed)
Subjective: patient is here for followup of her chronic cervical neck pain. The patient was doing very well for quite some time but recently went on vacation and did have a lot of carrying of baggages.  Patient has had some increasing stress of late as well with family getting ready to move to [facility] in the near future.  Patient has not had any new injury. Patient denies any radiation down the arm or into her hand.  Patient does have a cervical neck MRI back in June 2013 that showed stable left-sided spurring changes and a shallow disc protrusion at C4-5 with mild left foraminal stenosis. She also had degenerative disc disease at C5-C6.   Objective: Blood pressure 102/73, height 5\' 5"  (1.651 m), weight 122 lb (55.339 kg). General: No apparent distress alert and oriented x3 mood and affect normal  OMT Physical Exam  Cervical  C2 RS left C3 flexed rotated and side bent right Elevated first rib on right with T1 rotated and side the right  Thoracic As above T7 extended rotated and side bent right T9 E RS right  Lumbar Neutral Sacrum Left on left Illium neutral

## 2012-12-05 ENCOUNTER — Ambulatory Visit: Payer: BC Managed Care – PPO | Admitting: Family Medicine

## 2013-01-26 ENCOUNTER — Ambulatory Visit: Payer: BC Managed Care – PPO | Admitting: Psychology

## 2013-01-31 ENCOUNTER — Other Ambulatory Visit: Payer: Self-pay | Admitting: *Deleted

## 2013-01-31 MED ORDER — DICLOFENAC SODIUM 75 MG PO TBEC: 75.0000 mg | DELAYED_RELEASE_TABLET | Freq: Two times a day (BID) | ORAL | Status: DC

## 2013-02-15 ENCOUNTER — Ambulatory Visit (INDEPENDENT_AMBULATORY_CARE_PROVIDER_SITE_OTHER): Payer: BC Managed Care – PPO | Admitting: Family Medicine

## 2013-02-15 VITALS — BP 118/80 | HR 68 | Wt 124.0 lb

## 2013-02-15 DIAGNOSIS — M24551 Contracture, right hip: Secondary | ICD-10-CM

## 2013-02-15 DIAGNOSIS — M999 Biomechanical lesion, unspecified: Secondary | ICD-10-CM

## 2013-02-15 DIAGNOSIS — M9981 Other biomechanical lesions of cervical region: Secondary | ICD-10-CM

## 2013-02-15 DIAGNOSIS — M24559 Contracture, unspecified hip: Secondary | ICD-10-CM | POA: Insufficient documentation

## 2013-02-15 DIAGNOSIS — M6240 Contracture of muscle, unspecified site: Secondary | ICD-10-CM

## 2013-02-15 DIAGNOSIS — M549 Dorsalgia, unspecified: Secondary | ICD-10-CM | POA: Insufficient documentation

## 2013-02-15 MED ORDER — DICLOFENAC SODIUM 75 MG PO TBEC: 75.0000 mg | DELAYED_RELEASE_TABLET | Freq: Two times a day (BID) | ORAL | Status: DC

## 2013-02-15 NOTE — Progress Notes (Addendum)
Subjective: patient is here for followup of her chronic cervical neck pain. Patient states that her cervical neck pain seems to be doing relatively well. Patient unfortunately has started training for another triathlon and started having some right hip pain which is a new problem. Patient states that the pain is anteriorly worse with extension, describes the pain as a dull aching sensation. Patient has switched shoes recently and she thinks that this is make it worse. Patient is starting to work with a trainer who has been pushing or further. Patient denies any radiation of pain, any numbness or tingling lower extremity. Patient states though if going from a seated position to standing after sitting a long amount of time can be very difficult. Patient does have a cervical neck MRI back in June 2013 that showed stable left-sided spurring changes and a shallow disc protrusion at C4-5 with mild left foraminal stenosis. She also had degenerative disc disease at C5-C6.  ROS: Denies fever, chills, nausea vomiting abdominal pain, dysuria, chest pain, shortness of breath dyspnea on exertion or numbness in extremities  Past medical history, social, surgical and family history all reviewed.     Objective: Blood pressure 118/80, pulse 68, weight 124 lb (56.246 kg), SpO2 98.00%. General: No apparent distress alert and oriented x3 mood and affect normal Respiratory: Patient's speak in full sentences and does not appear short of breath Cardiovascular: No lower extremity edema Abdomen soft nontender Skin: Warm dry intact with no signs of infection or rash Neuro: Cranial nerves II through XII are intact, neurovascularly intact in all extremities with 2+ DTRs and 2+ pulses.  Back Exam:  Inspection: Unremarkable does have incision from previous microdiscectomy of the lumbar spine. This is well-healed. Motion: Flexion 45 deg, Extension 45 deg, Side Bending to 45 deg bilaterally,  Rotation to 45 deg bilaterally   SLR laying: Negative  XSLR laying: Negative  Palpable tenderness: None. FABER: negative. Sensory change: Gross sensation intact to all lumbar and sacral dermatomes.  Reflexes: 2+ at both patellar tendons, 2+ at achilles tendons, Babinski's downgoing.  Strength at foot  Plantar-flexion: 5/5 Dorsi-flexion: 5/5 Eversion: 5/5 Inversion: 5/5  Leg strength  Quad: 5/5 Hamstring: 5/5 Hip flexor: 5/5 Hip abductors: 5/5  Gait unremarkable. Hip: Right ROM IR: 45 Deg, ER: 45 Deg, Flexion: 120 Deg, Extension: 100 Deg, Abduction: 45 Deg, Adduction: 45 Deg Strength IR: 5/5, ER: 5/5, Flexion: 5/5, Extension: 5/5, Abduction: 5/5, Adduction: 5/5 Pelvic alignmen tender to palpation over the pubic bone tight hip flexors. Standing hip rotation and gait without trendelenburg sign / unsteadiness. Greater trochanter without tenderness to palpation. No tenderness over piriformis and greater trochanter. No pain with FABER or FADIR. No SI joint tenderness and normal minimal SI movement.  OMT Physical Exam  Cervical  C2 RS left C3 flexed rotated and side bent right   Thoracic T7 extended rotated and side bent right T9 E RS right  Lumbar Neutral  Sacrum Left on left  Illium Right anterior

## 2013-02-15 NOTE — Assessment & Plan Note (Signed)
Decision today to treat with OMT was based on Physical Exam  After verbal consent patient was treated with HVLA, ME, FPR techniques in cervical, thoracic and lumbar areas  Patient tolerated the procedure well with improvement in symptoms  Patient given exercises, stretches and lifestyle modifications  See medications in patient instructions if given  Patient will follow up in 6 weeks          

## 2013-02-15 NOTE — Assessment & Plan Note (Signed)
Discussed with patient for greater than 45 minutes about different exercises, techniques, diagnosis and prognosis. Handout given. Patient will continue to work with her trainer but will now added in different stretches that I have given her. Patient did respond to osteopathic manipulation. We'll have patient come back again in 3-4 weeks for further evaluation.

## 2013-03-06 ENCOUNTER — Ambulatory Visit (INDEPENDENT_AMBULATORY_CARE_PROVIDER_SITE_OTHER): Payer: BC Managed Care – PPO | Admitting: Family Medicine

## 2013-03-06 ENCOUNTER — Encounter: Payer: Self-pay | Admitting: Family Medicine

## 2013-03-06 VITALS — BP 112/74 | HR 67 | Wt 121.0 lb

## 2013-03-06 DIAGNOSIS — M9981 Other biomechanical lesions of cervical region: Secondary | ICD-10-CM

## 2013-03-06 DIAGNOSIS — M999 Biomechanical lesion, unspecified: Secondary | ICD-10-CM

## 2013-03-06 DIAGNOSIS — M542 Cervicalgia: Secondary | ICD-10-CM | POA: Insufficient documentation

## 2013-03-06 DIAGNOSIS — M25559 Pain in unspecified hip: Secondary | ICD-10-CM

## 2013-03-06 DIAGNOSIS — M25551 Pain in right hip: Secondary | ICD-10-CM

## 2013-03-06 NOTE — Assessment & Plan Note (Signed)
Decision today to treat with OMT was based on Physical Exam  After verbal consent patient was treated with HVLa and ME techniques in cervical, thoracic and sacral areas  Patient tolerated the procedure well with improvement in symptoms  Patient given exercises, stretches and lifestyle modifications  See medications in patient instructions if given  Patient will follow up in 1-2 weeks after triathalon.

## 2013-03-06 NOTE — Assessment & Plan Note (Signed)
After verbal consent patient was treated with HVLa and ME techniques in cervical, thoracic and sacral areas  Patient tolerated the procedure well with improvement in symptoms  Patient given exercises, stretches and lifestyle modifications  See medications in patient instructions if given  Patient will follow up in 1-2 weeks after triathalon.

## 2013-03-06 NOTE — Assessment & Plan Note (Signed)
Hip flexor strain that seems to be improving. Encourage her to continue to do certain exercises and give her another extension based stretching mechanism. Patient is responding to osteopathic manipulation as well as other holistic remedies.  We'll have patient come back next week after triathlon.

## 2013-03-06 NOTE — Progress Notes (Signed)
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 Patient overall is doing well. Patient is training for a triathlon that is going to happen next week. Patient has noticed some increasing stiffness secondary to her increasing exercising. Patient denies any new symptoms such as radiation down the arm or any numbness or tingling in the fingertips. Patient also denies any weakness.  Patient was also seen for hip discomfort last time that she states is getting better. She did see a ralpher recently and she thinks this has helped as well. Patient states that she only has some mild discomfort now with running in the our afterwards. Patient did get new shoes which has seemed to help. Patient denies any radiation of pain, any numbness or tingling lower extremity. Patient states though if going from a seated position to standing after sitting a long amount of time can be very difficult.  Significant past medical history Patient does have a cervical neck MRI back in June 2013 that showed stable left-sided spurring changes and a shallow disc protrusion at C4-5 with mild left foraminal stenosis. She also had degenerative disc disease at C5-C6.  ROS: Denies fever, chills, nausea vomiting abdominal pain, dysuria, chest pain, shortness of breath dyspnea on exertion or numbness in extremities  Past medical history, social, surgical and family history all reviewed.     Objective: Blood pressure 112/74, pulse 67, weight 121 lb (54.885 kg), SpO2 98.00%. General: No apparent distress alert and oriented x3 mood and affect normal Respiratory: Patient's speak in full sentences and does not appear short of breath Cardiovascular: No lower extremity edema Abdomen soft nontender Skin: Warm dry intact with no signs of infection or rash Neuro: Cranial nerves II through XII are intact, neurovascularly intact in all extremities with 2+ DTRs and 2+ pulses.  Back  Exam:  Inspection: Unremarkable does have incision from previous microdiscectomy of the lumbar spine. This is well-healed. Motion: Flexion 45 deg, Extension 45 deg, Side Bending to 45 deg bilaterally,  Rotation to 45 deg bilaterally  SLR laying: Negative  XSLR laying: Negative  Palpable tenderness: None. FABER: negative. Sensory change: Gross sensation intact to all lumbar and sacral dermatomes.  Reflexes: 2+ at both patellar tendons, 2+ at achilles tendons, Babinski's downgoing.  Strength at foot  Plantar-flexion: 5/5 Dorsi-flexion: 5/5 Eversion: 5/5 Inversion: 5/5  Leg strength  Quad: 5/5 Hamstring: 5/5 Hip flexor: 5/5 Hip abductors: 5/5  Gait unremarkable. Hip: Right ROM IR: 35 Deg, ER: 45 Deg, Flexion: 120 Deg, Extension: 100 Deg, Abduction: 45 Deg, Adduction: 45 Deg Strength IR: 5/5, ER: 5/5, Flexion: 5/5, Extension: 5/5, Abduction: 5/5, Adduction: 5/5 Pelvic alignmen tender to palpation over the pubic bone tight hip flexors. Standing hip rotation and gait without trendelenburg sign / unsteadiness. Greater trochanter without tenderness to palpation. No tenderness over piriformis and greater trochanter. No pain with FABER or FADIR. No SI joint tenderness and normal minimal SI movement.  OMT Physical Exam  Cervical  C2 RS left  Thoracic T7 extended rotated and side bent right T9 E RS right  Lumbar Neutral  Sacrum Left on left  Illium Right anterior

## 2013-03-15 ENCOUNTER — Other Ambulatory Visit: Payer: Self-pay

## 2013-03-15 ENCOUNTER — Ambulatory Visit (INDEPENDENT_AMBULATORY_CARE_PROVIDER_SITE_OTHER): Payer: BC Managed Care – PPO | Admitting: Family Medicine

## 2013-03-15 ENCOUNTER — Encounter: Payer: Self-pay | Admitting: Family Medicine

## 2013-03-15 VITALS — BP 110/72 | HR 80 | Wt 124.0 lb

## 2013-03-15 DIAGNOSIS — M999 Biomechanical lesion, unspecified: Secondary | ICD-10-CM

## 2013-03-15 DIAGNOSIS — M76811 Anterior tibial syndrome, right leg: Secondary | ICD-10-CM | POA: Insufficient documentation

## 2013-03-15 DIAGNOSIS — M549 Dorsalgia, unspecified: Secondary | ICD-10-CM

## 2013-03-15 DIAGNOSIS — M76829 Posterior tibial tendinitis, unspecified leg: Secondary | ICD-10-CM

## 2013-03-15 DIAGNOSIS — M542 Cervicalgia: Secondary | ICD-10-CM

## 2013-03-15 DIAGNOSIS — M9981 Other biomechanical lesions of cervical region: Secondary | ICD-10-CM

## 2013-03-15 DIAGNOSIS — Z1231 Encounter for screening mammogram for malignant neoplasm of breast: Secondary | ICD-10-CM

## 2013-03-15 NOTE — Assessment & Plan Note (Signed)
Doing better overall Continue same exercise plan working on upper back strengthening. See patient again in 4 weeks

## 2013-03-15 NOTE — Patient Instructions (Signed)
You are amazing! Icing of the foot and ankle 20 minutes 2 times a day Cross train more with biking and avoid 10 mile run this weekend. Also decrease hills would be good.  Hip abductors 2 sets of 30 each side.  Paint can with ankle across in a circle x 3 each ankle Try exercises I am giving you You are doing so well lets say 4 weeks.

## 2013-03-15 NOTE — Assessment & Plan Note (Signed)
  Patient tolerated the procedure well with improvement in symptoms  Patient given exercises, stretches and lifestyle modifications  See medications in patient instructions if given  Patient will follow up in 4 weeks.

## 2013-03-15 NOTE — Assessment & Plan Note (Signed)
After verbal consent patient was treated with HVLa and ME techniques in cervical, thoracic and sacral areas  Patient tolerated the procedure well with improvement in symptoms  Patient given exercises, stretches and lifestyle modifications  See medications in patient instructions if given  Patient will follow up in 4 weeks.

## 2013-03-15 NOTE — Assessment & Plan Note (Signed)
I think of is right now she has more of a tendinopathy. In the area though patient does have significant probability of having a stress fracture with her increased work load is we will watch religiously. Home exercise program given Discussed crosstraining with more low impact exercises the Discussed the importance of calcium and vitamin D Patient will return again in 4 weeks for further evaluation.

## 2013-03-15 NOTE — Progress Notes (Signed)
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 Patient did participate in triathlons last weekend. Patient states she has been doing very well but started to go on a run today and had to stop running secondary to right ankle pain. Patient states that this onset just occurred now. Patient does have a past medical history significant for tibial stress fractures and would like to avoid this. Patient continues to take her calcium and vitamin D. Patient denies any swelling, any radiation of pain or any numbness. She is being treated for plantar fasciitis of the feet bilaterally by Ralpher.  Patient does wear custom orthotics or any at this time which is somewhat helpful  Significant past medical history Patient does have a cervical neck MRI back in June 2013 that showed stable left-sided spurring changes and a shallow disc protrusion at C4-5 with mild left foraminal stenosis. She also had degenerative disc disease at C5-C6.  ROS: Denies fever, chills, nausea vomiting abdominal pain, dysuria, chest pain, shortness of breath dyspnea on exertion or numbness in extremities  Past medical history, social, surgical and family history all reviewed.     Objective: Blood pressure 110/72, pulse 80, weight 124 lb (56.246 kg). General: No apparent distress alert and oriented x3 mood and affect normal Respiratory: Patient's speak in full sentences and does not appear short of breath Cardiovascular: No lower extremity edema Abdomen soft nontender Skin: Warm dry intact with no signs of infection or rash Neuro: Cranial nerves II through XII are intact, neurovascularly intact in all extremities with 2+ DTRs and 2+ pulses.  Back Exam:  Inspection: Unremarkable does have incision from previous microdiscectomy of the lumbar spine. This is well-healed. Motion: Flexion 45 deg, Extension 45 deg, Side Bending to 45 deg bilaterally,  Rotation to 45 deg  bilaterally  SLR laying: Negative  XSLR laying: Negative  Palpable tenderness: None. FABER: negative. Sensory change: Gross sensation intact to all lumbar and sacral dermatomes.  Reflexes: 2+ at both patellar tendons, 2+ at achilles tendons, Babinski's downgoing.  Strength at foot  Plantar-flexion: 5/5 Dorsi-flexion: 5/5 Eversion: 5/5 Inversion: 5/5  Leg strength  Quad: 5/5 Hamstring: 5/5 Hip flexor: 5/5 Hip abductors: 5/5  Gait unremarkable. Hip: Right ROM IR: 35 Deg, ER: 45 Deg, Flexion: 120 Deg, Extension: 100 Deg, Abduction: 45 Deg, Adduction: 45 Deg Strength IR: 5/5, ER: 5/5, Flexion: 5/5, Extension: 5/5, Abduction: 5/5, Adduction: 5/5 Pelvic alignmen tender to palpation over the pubic bone tight hip flexors. Standing hip rotation and gait without trendelenburg sign / unsteadiness. Greater trochanter without tenderness to palpation. No tenderness over piriformis and greater trochanter. No pain with FABER or FADIR. No SI joint tenderness and normal minimal SI movement. Ankle: No visible erythema or swelling. Range of motion is full in all directions. Strength is 5/5 in all directions. Stable lateral and medial ligaments; squeeze test and kleiger test unremarkable; Talar dome nontender; No pain at base of 5th MT; No tenderness over cuboid; No tenderness over N spot or navicular prominence No tenderness on posterior aspects of lateral and medial malleolus No sign of peroneal tendon subluxations or tenderness to palpation Negative tarsal tunnel tinel's Able to walk 4 steps. Patient though is somewhat tender to palpation over the anterior tibialis tendon and just medial to this aspect. OMT Physical Exam  Cervical  C2 RS left  Thoracic T7 extended rotated and side bent right   Lumbar L2 F RS left  Sacrum Left on left  Illium neutral

## 2013-03-19 ENCOUNTER — Telehealth: Payer: Self-pay | Admitting: *Deleted

## 2013-03-19 NOTE — Telephone Encounter (Signed)
Called patient back. Gave her correct exercises through her E-mail

## 2013-03-19 NOTE — Telephone Encounter (Signed)
Pt called states she believes the exercise sheets given to her were incorrect.  Requests a return call from Dr Katrinka Blazing.  Please advise

## 2013-03-27 ENCOUNTER — Ambulatory Visit
Admission: RE | Admit: 2013-03-27 | Discharge: 2013-03-27 | Disposition: A | Discharge status: Home / Self Care | Payer: BC Managed Care – PPO | Source: Ambulatory Visit

## 2013-03-27 DIAGNOSIS — Z1231 Encounter for screening mammogram for malignant neoplasm of breast: Secondary | ICD-10-CM

## 2013-03-28 ENCOUNTER — Ambulatory Visit (INDEPENDENT_AMBULATORY_CARE_PROVIDER_SITE_OTHER): Payer: BC Managed Care – PPO | Admitting: Family Medicine

## 2013-03-28 VITALS — BP 110/68 | HR 70

## 2013-03-28 DIAGNOSIS — S86899A Other injury of other muscle(s) and tendon(s) at lower leg level, unspecified leg, initial encounter: Secondary | ICD-10-CM

## 2013-03-28 DIAGNOSIS — IMO0002 Reserved for concepts with insufficient information to code with codable children: Secondary | ICD-10-CM

## 2013-03-28 NOTE — Patient Instructions (Addendum)
I am sorry but you have a stress fracture Try these exercise most days of the week Biking and swimming ok Boot with any ambulation for next 2 weeks.  Icing 20 minutes 3 times a day Nitro patch in area.  You are getting good blood flow Vitamin D 5000IU daily.  Come back in 2 weeks and we will see if it is improving.

## 2013-03-29 ENCOUNTER — Encounter: Payer: Self-pay | Admitting: Family Medicine

## 2013-03-29 DIAGNOSIS — S86899A Other injury of other muscle(s) and tendon(s) at lower leg level, unspecified leg, initial encounter: Secondary | ICD-10-CM | POA: Insufficient documentation

## 2013-03-29 NOTE — Assessment & Plan Note (Signed)
Patient placed in Cam Walker boot today which was fitted by me This will do icing protocol Nitroglycerin patch Avoid running for the next 2 weeks. Compression sleeve with activity Come back in 2 weeks for further evaluation. At that time we may be able to get patient back into a shoe but still likely no running for another week and then patient may do marathon but then will go right back into the Cam Walker for an additional 2 weeks. Stacy Terrell

## 2013-03-29 NOTE — Progress Notes (Signed)
  Subjective:    CC: Right ankle pain  HPI: Patient is coming in with a new problem. Patient was seen before for an anterior tibialis tendinitis to the right leg but she states that this is different pain. Patient states this is approximately a couple inches above the medial aspect of her ankle on the right side. Patient states that it has started to get worse. Patient first noticed it when she was doing her any triathlons the season. Patient states that she has stopped doing any type a running but even with walking she is having discomfort. Patient describes it is more of a dull throbbing aching sensation it seems to become more intense. It is worse with activity and right afterwards. Patient has been doing some icing exercises with minimal improvement. Patient puts the severity of 7/10. Patient has a goal of doing one more marathon the season.  Past medical history, Surgical history, Family history not pertinant except as noted below, Social history, Allergies, and medications have been entered into the medical record, reviewed, and no changes needed.   Review of Systems: No fevers, chills, night sweats, weight loss, chest pain, or shortness of breath.   Objective:   Blood pressure 110/68, pulse 70, SpO2 98.00%.  General: Well Developed, well nourished, and in no acute distress.  Neuro: Alert and oriented x3, extra-ocular muscles intact, sensation grossly intact.  HEENT: Normocephalic, atraumatic, pupils equal round reactive to light, neck supple, no masses, no lymphadenopathy, thyroid nonpalpable.  Skin: Warm and dry, no rashes. Cardiac:  no lower extremity edema. Respiratory: Not using accessory muscles, speaking in full sentences. Abdominal: NT, soft Gait: Nonantlagic, good balance and coordination Lymphatic: no lymphadenopathy in neck or axillae on palpation, non tender.  Musculoskeletal: Inspection and palpation of the right and left upper extremities including the shoulders elbows and  wrist are unremarkable with full range of motion and good muscle strength and tone. Inspection and palpation of the right and left lower extremities including the hips knees and ankles are unremarkable and nontender with full range of motion and good muscle strength and tone and are symmetric. Just above the medial malleolus approximately 4 cm proximal patient hasn't diffuse area of tenderness. This seems to be some localization right over the medial posterior aspect of the tibia. Patient involuntarily does withdraw from palpation. She is neurovascularly intact distally.  MSK US performed of: Right ankle This study was ordered, performed, and interpreted by Terrilee Files D.O.  Foot/Ankle:   All structures visualized.   Talar dome unremarkable  Ankle mortise without effusion. Peroneus longus and brevis tendons unremarkable on long and transverse views without sheath effusions. Posterior tibialis, flexor hallucis longus, and flexor digitorum longus tendons unremarkable on long and transverse views without sheath effusions. Achilles tendon visualized along length of tendon and unremarkable on long and transverse views without sheath effusion. Anterior Talofibular Ligament and Calcaneofibular Ligaments unremarkable and intact. Deltoid Ligament unremarkable and intact. Plantar fascia intact and without effusion, normal thickness. No increased doppler signal, cap sign, or thickening of tibial cortex. Patient unfortunately does have an area on the tibia and it does show increased Doppler flow specially on the bone itself with what appears to be callus formation starting to bridge that is approximately 0.25 cm in length. I do not appreciate a cortical defect.  IMPRESSION:  Early stress fracture of the right medial border of the tibia.    Impression and Recommendations:

## 2013-04-12 ENCOUNTER — Ambulatory Visit: Payer: BC Managed Care – PPO | Admitting: Family Medicine

## 2013-04-16 ENCOUNTER — Ambulatory Visit (INDEPENDENT_AMBULATORY_CARE_PROVIDER_SITE_OTHER): Payer: BC Managed Care – PPO | Admitting: Family Medicine

## 2013-04-16 ENCOUNTER — Encounter: Payer: Self-pay | Admitting: Family Medicine

## 2013-04-16 VITALS — BP 114/70 | HR 74

## 2013-04-16 DIAGNOSIS — S86899D Other injury of other muscle(s) and tendon(s) at lower leg level, unspecified leg, subsequent encounter: Secondary | ICD-10-CM

## 2013-04-16 DIAGNOSIS — Z5189 Encounter for other specified aftercare: Secondary | ICD-10-CM

## 2013-04-16 NOTE — Patient Instructions (Signed)
Good to see you Switch to a shoe No running still Swimming and biking fine Exercises daily Nitro is up to you Ice still when needed After 2 weeks start waling 20 minutes 3 times a day and see how it feels.  Come back again in 3 weeks.

## 2013-04-16 NOTE — Assessment & Plan Note (Signed)
Patient is improving at this time. Patient can come out of the [employer] and wear shoe. Discuss continued icing and exercises. With patient being a high endurance athlete we will get her to start doing more functional exercises with her ankles to avoid any injury when she starts training again. Home exercises given for this. Patient will continue to avoid running for the next 2 weeks. Patient can start walking in weeks 3. We'll have patient followup again in 3 weeks.

## 2013-04-16 NOTE — Progress Notes (Signed)
  Subjective:    CC: Right ankle pain  HPI: Patient is here for followup of her ankle pain. Patient was diagnosed with tibial stress fractures and put in a Cam Walker. Patient has been doing a nitroglycerin patch without any significant side effects. Patient states that the pain has improved. Patient continues to do hiking and swimming without any significant discomfort. Patient did increase her calcium intake overall patient feels that she is improving.  Past medical history, Surgical history, Family history not pertinant except as noted below, Social history, Allergies, and medications have been entered into the medical record, reviewed, and no changes needed.   Review of Systems: No fevers, chills, night sweats, weight loss, chest pain, or shortness of breath.   Objective:   Blood pressure 114/70, pulse 74, SpO2 97.00%.  General: Well Developed, well nourished, and in no acute distress.  Neuro: Alert and oriented x3, extra-ocular muscles intact, sensation grossly intact.  HEENT: Normocephalic, atraumatic, pupils equal round reactive to light, neck supple, no masses, no lymphadenopathy, thyroid nonpalpable.  Skin: Warm and dry, no rashes. Cardiac:  no lower extremity edema. Respiratory: Not using accessory muscles, speaking in full sentences. Abdominal: NT, soft Gait: Nonantlagic, good balance and coordination Lymphatic: no lymphadenopathy in neck or axillae on palpation, non tender.  Musculoskeletal: Inspection and palpation of the right and left upper extremities including the shoulders elbows and wrist are unremarkable with full range of motion and good muscle strength and tone. Inspection and palpation of the right and left lower extremities including the hips knees and ankles are unremarkable and nontender with full range of motion and good muscle strength and tone and are symmetric. Just above the medial malleolus approximately 4 cm proximal patient hasn't diffuse area of tenderness  but significantly improved from last visit. Callus formation can even be felt on the medial posterior aspect of the tibia She is neurovascularly intact distally.  MSK US performed of: Right ankle This study was ordered, performed, and interpreted by Terrilee Files D.O.  Foot/Ankle:   All structures visualized.   Talar dome unremarkable  Ankle mortise without effusion. Peroneus longus and brevis tendons unremarkable on long and transverse views without sheath effusions. Posterior tibialis, flexor hallucis longus, and flexor digitorum longus tendons unremarkable on long and transverse views without sheath effusions. Achilles tendon visualized along length of tendon and unremarkable on long and transverse views without sheath effusion. Anterior Talofibular Ligament and Calcaneofibular Ligaments unremarkable and intact. Deltoid Ligament unremarkable and intact. Plantar fascia intact and without effusion, normal thickness.  In area where the tibia was seen to have stress fractures patient actually has fantastic callus formation. There is still some areas where there is cortical defect but these are bridged. Patient still has good Doppler flow.  IMPRESSION:  Healing stress fracture of the medial border of the right tibia   Impression and Recommendations:

## 2013-04-19 ENCOUNTER — Telehealth: Payer: Self-pay | Admitting: Family Medicine

## 2013-04-19 MED ORDER — LIDOCAINE 5 % EX PTCH: 1.0000 | MEDICATED_PATCH | CUTANEOUS | Status: DC

## 2013-04-19 MED ORDER — DIAZEPAM 2 MG PO TABS: 2.0000 mg | ORAL_TABLET | Freq: Two times a day (BID) | ORAL | Status: DC | PRN

## 2013-04-19 NOTE — Telephone Encounter (Signed)
Pt called stated that cyclobenzaprine is not work for her neck pain, pt request something different to help with this. Pt also request new rx for lidocaine patch for her neck pain also(used to be on this med). Please call pt with any question.

## 2013-04-19 NOTE — Telephone Encounter (Signed)
Done sent in lidocaine patch, please tell her to only wear it 12 hours at any one time.   Valium faxed in but to be  Careful.  If doesn't work would need to be seen on Monday.    Called patient and told her.

## 2013-04-25 ENCOUNTER — Encounter: Payer: Self-pay | Admitting: Family Medicine

## 2013-04-25 ENCOUNTER — Ambulatory Visit (INDEPENDENT_AMBULATORY_CARE_PROVIDER_SITE_OTHER): Payer: BC Managed Care – PPO | Admitting: Family Medicine

## 2013-04-25 VITALS — BP 106/72 | HR 64

## 2013-04-25 DIAGNOSIS — M999 Biomechanical lesion, unspecified: Secondary | ICD-10-CM

## 2013-04-25 DIAGNOSIS — M62838 Other muscle spasm: Secondary | ICD-10-CM | POA: Insufficient documentation

## 2013-04-25 DIAGNOSIS — M9981 Other biomechanical lesions of cervical region: Secondary | ICD-10-CM

## 2013-04-25 NOTE — Assessment & Plan Note (Signed)
After verbal consent patient was treated with HVLa and ME techniques in cervical, thoracic areas  Patient tolerated the procedure well with improvement in symptoms  Patient given exercises, stretches and lifestyle modifications  See medications in patient instructions if given  Patient will follow up in 1 weeks.

## 2013-04-25 NOTE — Progress Notes (Signed)
Chief complaint: Exacerbation of neck pain  Subjective: Patient does have a past medical history for degenerative disc disease at the C5-6 level. Patient continues to have neck pain now on the left side. This occurred approximately 1 day after last visit. Patient feels that the pain is radiating nerves somewhat and is having some mild numbness. His pain seems to be constant and unrelenting. Patient has tried the lidocaine patch as well as Valium with minimal benefit. Patient is finding it difficult to sleep on a regular basis. Patient is here for further evaluation of potential manipulation. Denies any headache, any fevers chills  Significant past medical history Patient does have a cervical neck MRI back in June 2013 that showed stable left-sided spurring changes and a shallow disc protrusion at C4-5 with mild left foraminal stenosis. She also had degenerative disc disease at C5-C6.  ROS: Denies fever, chills, nausea vomiting abdominal pain, dysuria, chest pain, shortness of breath dyspnea on exertion or numbness in extremities  Past medical history, social, surgical and family history all reviewed.     Objective: Blood pressure 106/72, pulse 64, SpO2 97.00%. General: No apparent distress alert and oriented x3 mood and affect normal Respiratory: Patient's speak in full sentences and does not appear short of breath Cardiovascular: No lower extremity edema Abdomen soft nontender Skin: Warm dry intact with no signs of infection or rash Neuro: Cranial nerves II through XII are intact, neurovascularly intact in all extremities with 2+ DTRs and 2+ pulses. Neck: Inspection unremarkable. No palpable stepoffs. Negative Spurling's maneuver. Full neck range of motion Grip strength and sensation normal in bilateral hands Strength good C4 to T1 distribution Mild decreased sensory of the C5 distribution on the left side no weakness no. Negative Hoffman sign bilaterally Reflexes normal   OMT  Physical Exam  Cervical  C2 RS left C7 flexed rotated and side bent  Thoracic T7 extended rotated and side bent right Significant muscle spasm of the trapezius on the left side  Lumbar L2 F RS left  Sacrum Left on left  Illium neutral  After verbal consent patient was prepped with alcohol swab and did have an injection of 0.5 cc of 0.5% Marcaine and 0.5 cc of Kenalog 40 mg/dL into a trigger point felt in the left trapezius with a 30-gauge 1 inch needle. Patient tolerated the procedure well.

## 2013-04-25 NOTE — Patient Instructions (Signed)
Next week. Let ssee how we are doing.

## 2013-04-25 NOTE — Assessment & Plan Note (Signed)
After verbal consent patient was treated with HVLa and ME techniques in cervical, thoracic areas  Patient tolerated the procedure well with improvement in symptoms  Patient given exercises, stretches and lifestyle modifications  See medications in patient instructions if given  Patient will follow up in 1 week

## 2013-04-25 NOTE — Assessment & Plan Note (Signed)
Patient does have a trapezius muscle spasm that did respond very well to trigger point injection. Patient given some home exercises to do a regular basis. I am concerned the patient's neck pain it could be secondary to a bulging disc of the C5-6 area giving C5 nerve root impingement. Patient has had previous imaging showing very similar pathology. We'll see patient responds to this manipulation as well as conservative therapy. If unfortunately does not seem to improve next week we will consider further imaging including x-ray and MRI.

## 2013-05-02 ENCOUNTER — Ambulatory Visit: Payer: BC Managed Care – PPO | Admitting: Family Medicine

## 2013-05-11 ENCOUNTER — Encounter: Payer: Self-pay | Admitting: Family Medicine

## 2013-05-11 ENCOUNTER — Ambulatory Visit (INDEPENDENT_AMBULATORY_CARE_PROVIDER_SITE_OTHER): Payer: BC Managed Care – PPO | Admitting: Family Medicine

## 2013-05-11 VITALS — BP 110/76 | HR 64

## 2013-05-11 DIAGNOSIS — M999 Biomechanical lesion, unspecified: Secondary | ICD-10-CM

## 2013-05-11 DIAGNOSIS — Z5189 Encounter for other specified aftercare: Secondary | ICD-10-CM

## 2013-05-11 DIAGNOSIS — S86899D Other injury of other muscle(s) and tendon(s) at lower leg level, unspecified leg, subsequent encounter: Secondary | ICD-10-CM

## 2013-05-11 DIAGNOSIS — M542 Cervicalgia: Secondary | ICD-10-CM

## 2013-05-11 DIAGNOSIS — M9981 Other biomechanical lesions of cervical region: Secondary | ICD-10-CM

## 2013-05-11 NOTE — Assessment & Plan Note (Signed)
Patient is seen another provider and get an MRI. There is a possibility that this is a stress reaction versus stress fracture.

## 2013-05-11 NOTE — Assessment & Plan Note (Signed)
Patient's neck pain seems to be improving somewhat. I do think there is some underlying anxiety that could be contributing to her problem. 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 Patient has seen a neurologist or neurosurgeon for this problem previously. If he continues to be a problem she may want to consider radiofrequency ablation. If she keeps responding to osteopathic manipulation she can continue to return. Patient will followup again in 4-6 weeks. We did discuss also avoiding overhead activity.

## 2013-05-11 NOTE — Assessment & Plan Note (Signed)
After verbal consent patient was treated with  ME, fpr techniques in cervical, thoracic and lumbar areas  Patient tolerated the procedure well with improvement in symptoms  Patient given exercises, stretches and lifestyle modifications  See medications in patient instructions if given  Patient will follow up in 4-6 week 

## 2013-05-11 NOTE — Assessment & Plan Note (Signed)
After verbal consent patient was treated with  ME, fpr techniques in cervical, thoracic and lumbar areas  Patient tolerated the procedure well with improvement in symptoms  Patient given exercises, stretches and lifestyle modifications  See medications in patient instructions if given  Patient will follow up in 4-6 week

## 2013-05-11 NOTE — Progress Notes (Signed)
Chief complaint: Followup neck pain  Subjective: Patient does have a past medical history for degenerative disc disease at the C5-6 level. Patient was diagnosed with more of the trapezius muscle spasm and had a trigger point injection. Patient states that she is doing better than last visit. She seems to be feeling well. Patient is here for further manipulation. Denies any radiation of pain or any numbness in the fingertips.  Patient also had what seemed to be a right tibial stress reaction previously. Patient did go and seek a second opinion because she did not seem to be making improvement over the last 10 weeks. Patient though was not wearing the boot as instructed and was not doing the exercises on her regular basis. Patient attempted to start running and had the same chronic dull aching pain but now it hurts with walking again. Patient is getting an MRI and we'll be following up at the orthopedic surgeons with this problem.   Significant past medical history Patient does have a cervical neck MRI back in June 2013 that showed stable left-sided spurring changes and a shallow disc protrusion at C4-5 with mild left foraminal stenosis. She also had degenerative disc disease at C5-C6.  ROS: Denies fever, chills, nausea vomiting abdominal pain, dysuria, chest pain, shortness of breath dyspnea on exertion or numbness in extremities  Past medical history, social, surgical and family history all reviewed.     Objective: Blood pressure 110/76, pulse 64, SpO2 97.00%. General: No apparent distress alert and oriented x3 mood and affect normal Respiratory: Patient's speak in full sentences and does not appear short of breath Cardiovascular: No lower extremity edema Abdomen soft nontender Skin: Warm dry intact with no signs of infection or rash Neuro: Cranial nerves II through XII are intact, neurovascularly intact in all extremities with 2+ DTRs and 2+ pulses. Neck: Inspection unremarkable. No  palpable stepoffs. Negative Spurling's maneuver. Full neck range of motion Grip strength and sensation normal in bilateral hands Strength good C4 to T1 distribution Mild decreased sensory of the C5 distribution on the left side no weakness no. Negative Hoffman sign bilaterally Reflexes normal   OMT Physical Exam  Cervical  C2 RS left C7 flexed rotated and side bent right  Thoracic T7 extended rotated and side bent right T5 extended rotated and side bent left  Lumbar L2 F RS left  Sacrum Left on left  Illium neutral

## 2013-05-11 NOTE — Patient Instructions (Signed)
Good to see you Avoid overhead activity including the bar with lunges Focus on kettle bells, keep all motion under chest if possible.  Come back again in 3-4 weeks.

## 2013-05-26 ENCOUNTER — Other Ambulatory Visit: Payer: Self-pay | Admitting: Family Medicine

## 2013-05-29 ENCOUNTER — Telehealth: Payer: Self-pay | Admitting: *Deleted

## 2013-05-29 NOTE — Telephone Encounter (Signed)
Pt called requesting documentation of lower leg U/S be faxed to [phone].  Please advise

## 2013-06-08 ENCOUNTER — Encounter: Payer: Self-pay | Admitting: Family Medicine

## 2013-06-08 ENCOUNTER — Ambulatory Visit (INDEPENDENT_AMBULATORY_CARE_PROVIDER_SITE_OTHER): Payer: BC Managed Care – PPO | Admitting: Family Medicine

## 2013-06-08 VITALS — BP 110/66 | HR 68

## 2013-06-08 DIAGNOSIS — M999 Biomechanical lesion, unspecified: Secondary | ICD-10-CM

## 2013-06-08 DIAGNOSIS — M9981 Other biomechanical lesions of cervical region: Secondary | ICD-10-CM

## 2013-06-08 DIAGNOSIS — M549 Dorsalgia, unspecified: Secondary | ICD-10-CM

## 2013-06-08 MED ORDER — NITROGLYCERIN 0.2 MG/HR TD PT24: MEDICATED_PATCH | TRANSDERMAL | Status: DC

## 2013-06-08 MED ORDER — FERROUS GLUCONATE 225 (27 FE) MG PO TABS: 240.0000 mg | ORAL_TABLET | Freq: Three times a day (TID) | ORAL | Status: DC

## 2013-06-08 MED ORDER — DIAZEPAM 2 MG PO TABS: 2.0000 mg | ORAL_TABLET | Freq: Two times a day (BID) | ORAL | Status: DC | PRN

## 2013-06-08 NOTE — Progress Notes (Signed)
Pre-visit discussion using our clinic review tool. No additional management support is needed unless otherwise documented below in the visit note.  

## 2013-06-08 NOTE — Assessment & Plan Note (Signed)
Patient does have an exacerbation secondary to the boot as well as the crutches. Patient was given a heel lift into her left shoe which will help negate the instability of the pelvis. Responding to osteopathic manipulation. Patient is on crutches we'll have her increase her frequency of visits. Patient will return in 2 weeks.

## 2013-06-08 NOTE — Assessment & Plan Note (Addendum)
After verbal consent patient was treated with  ME, fpr techniques in cervical, thoracic and lumbar and sacral areas  Patient tolerated the procedure well with improvement in symptoms  Patient given exercises, stretches and lifestyle modifications  See medications in patient instructions if given  Patient will follow up in 2 weeks  Spent greater than 25 minutes with patient face-to-face and had greater than 50% of counseling including as described above in assessment and plan.

## 2013-06-08 NOTE — Progress Notes (Signed)
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 Patient unfortunately is on crutches which is causing her to have significant more back pain than usual. Patient states that she's been treated for a stress fracture of the right tibia and is in a walking boot but is supposed to stay nonweightbearing to help with healing. This is causing her to have significant dysfunction of her lower back, midback, and neck. Patient states that she can radiation to certain limbs from time to time. Patient still able to do all her activities of daily living other than ambulation well.  Significant past medical history Patient does have a cervical neck MRI back in June 2013 that showed stable left-sided spurring changes and a shallow disc protrusion at C4-5 with mild left foraminal stenosis. She also had degenerative disc disease at C5-C6.  ROS: Denies fever, chills, nausea vomiting abdominal pain, dysuria, chest pain, shortness of breath dyspnea on exertion or numbness in extremities  Past medical history, social, surgical and family history all reviewed.     Objective: Blood pressure 110/66, pulse 68, SpO2 98.00%. General: No apparent distress alert and oriented x3 mood and affect normal Respiratory: Patient's speak in full sentences and does not appear short of breath Cardiovascular: No lower extremity edema Abdomen soft nontender Skin: Warm dry intact with no signs of infection or rash Neuro: Cranial nerves II through XII are intact, neurovascularly intact in all extremities with 2+ DTRs and 2+ pulses. Neck: Inspection unremarkable. No palpable stepoffs. Negative Spurling's maneuver. Full neck range of motion Grip strength and sensation normal in bilateral hands Strength good C4 to T1 distribution Mild decreased sensory of the C5 distribution on the left side no weakness no. Negative Hoffman sign  bilaterally Reflexes normal Back Exam:  Inspection: Unremarkable  Motion: Flexion 45 deg, Extension 45 deg, Side Bending to 45 deg bilaterally,  Rotation to 45 deg bilaterally  SLR laying: Negative  XSLR laying: Negative  Palpable tenderness: None. FABER: negative. Sensory change: Gross sensation intact to all lumbar and sacral dermatomes.  Reflexes: 2+ at both patellar tendons, 2+ at achilles tendons, Babinski's downgoing.  Strength at foot  Plantar-flexion: 5/5 Dorsi-flexion: 5/5 Eversion: 5/5 Inversion: 5/5  Leg strength  Quad: 5/5 Hamstring: 5/5 Hip flexor: 5/5 Hip abductors: 5/5     OMT Physical Exam  Cervical  C2 RS left C7 flexed rotated and side bent right  Thoracic T7 extended rotated and side bent right T5 extended rotated and side bent left  Lumbar L2 F RS left  Sacrum Left on left  Illium neutral

## 2013-06-08 NOTE — Assessment & Plan Note (Signed)
After verbal consent patient was treated with  ME, fpr techniques in cervical, thoracic and lumbar and sacral areas  Patient tolerated the procedure well with improvement in symptoms  Patient given exercises, stretches and lifestyle modifications  See medications in patient instructions if given  Patient will follow up in 2 weeks

## 2013-06-08 NOTE — Patient Instructions (Signed)
Good to see you Try the nitro, it wont hurt.  Come back on the 18th.

## 2013-06-08 NOTE — Assessment & Plan Note (Signed)
After verbal consent patient was treated with  ME, fpr techniques in cervical, thoracic and lumbar and sacral areas  Patient tolerated the procedure well with improvement in symptoms  Patient given exercises, stretches and lifestyle modifications  See medications in patient instructions if given  Patient will follow up in 2 weeks   

## 2013-06-21 ENCOUNTER — Ambulatory Visit: Payer: BC Managed Care – PPO | Admitting: Family Medicine

## 2013-07-10 ENCOUNTER — Encounter: Payer: Self-pay | Admitting: Family Medicine

## 2013-07-10 ENCOUNTER — Ambulatory Visit (INDEPENDENT_AMBULATORY_CARE_PROVIDER_SITE_OTHER): Payer: BC Managed Care – PPO | Admitting: Family Medicine

## 2013-07-10 ENCOUNTER — Other Ambulatory Visit (INDEPENDENT_AMBULATORY_CARE_PROVIDER_SITE_OTHER): Payer: BC Managed Care – PPO

## 2013-07-10 VITALS — BP 116/64 | HR 81

## 2013-07-10 DIAGNOSIS — M25519 Pain in unspecified shoulder: Secondary | ICD-10-CM

## 2013-07-10 DIAGNOSIS — M999 Biomechanical lesion, unspecified: Secondary | ICD-10-CM

## 2013-07-10 DIAGNOSIS — M67919 Unspecified disorder of synovium and tendon, unspecified shoulder: Secondary | ICD-10-CM

## 2013-07-10 DIAGNOSIS — M25512 Pain in left shoulder: Secondary | ICD-10-CM

## 2013-07-10 DIAGNOSIS — M9981 Other biomechanical lesions of cervical region: Secondary | ICD-10-CM

## 2013-07-10 DIAGNOSIS — M7552 Bursitis of left shoulder: Secondary | ICD-10-CM | POA: Insufficient documentation

## 2013-07-10 DIAGNOSIS — M719 Bursopathy, unspecified: Secondary | ICD-10-CM

## 2013-07-10 NOTE — Assessment & Plan Note (Signed)
Injected under ultrasound guidance today. Home exercise program to Patient has meloxicam at home and can try Discussed theraband and given some to use at home.  Will return in 3 weeks for further evaluation

## 2013-07-10 NOTE — Assessment & Plan Note (Signed)
After verbal consent patient was treated with  ME, fpr techniques in cervical, thoracic and lumbar and sacral areas  Patient tolerated the procedure well with improvement in symptoms  Patient given exercises, stretches and lifestyle modifications  See medications in patient instructions if given  Patient will follow up in 3 weks  Spent greater than 25 minutes with patient face-to-face and had greater than 50% of counseling including as described above in assessment and plan.   

## 2013-07-10 NOTE — Assessment & Plan Note (Signed)
After verbal consent patient was treated with  ME, fpr techniques in cervical, thoracic and lumbar and sacral areas  Patient tolerated the procedure well with improvement in symptoms  Patient given exercises, stretches and lifestyle modifications  See medications in patient instructions if given  Patient will follow up in 3 weks  Spent greater than 25 minutes with patient face-to-face and had greater than 50% of counseling including as described above in assessment and plan.

## 2013-07-10 NOTE — Patient Instructions (Signed)
Good to see you Do exercises for shoulder and hamstring Come back in 3 weeks and we will see if you are out of the boot and start rehab Look up Denice ParadiseAaron [place] and see what you think for him for PT

## 2013-07-10 NOTE — Progress Notes (Signed)
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 P patient is still walking around in her Cam Walker. This does cause her to have more back pain as well as hamstring tightness. Patient has been doing the exercises but not as much as she would like to. Patient has a followup appointment in 2 weeks for her stress fracture no facial becoming of the Cam Walker. No new symptoms in the back this unfortunately exacerbation of her regular back muscles.  Patient is a new pain now patient does have left shoulder pain. Worse with certain movements such as overhead or reaching across her body. Denies any radiation down her arm or any numbness or weakness. Patient states it is waking her up at night. Not responding to anti-inflammatories or ice. Pain is more on the posterior lateral aspect of the shoulder. Severity is 7/10 the   Significant past medical history Patient does have a cervical neck MRI back in June 2013 that showed stable left-sided spurring changes and a shallow disc protrusion at C4-5 with mild left foraminal stenosis. She also had degenerative disc disease at C5-C6.  ROS: Denies fever, chills, nausea vomiting abdominal pain, dysuria, chest pain, shortness of breath dyspnea on exertion or numbness in extremities  Past medical history, social, surgical and family history all reviewed.     Objective: Blood pressure 116/64, pulse 81, SpO2 99.00%. General: No apparent distress alert and oriented x3 mood and affect normal Respiratory: Patient's speak in full sentences and does not appear short of breath Cardiovascular: No lower extremity edema Abdomen soft nontender Skin: Warm dry intact with no signs of infection or rash Neuro: Cranial nerves II through XII are intact, neurovascularly intact in all extremities with 2+ DTRs and 2+ pulses. Neck: Inspection unremarkable. No palpable  stepoffs. Negative Spurling's maneuver. Full neck range of motion Grip strength and sensation normal in bilateral hands Strength good C4 to T1 distribution Mild decreased sensory of the C5 distribution on the left side no weakness no. Negative Hoffman sign bilaterally Reflexes normal Back Exam:  Inspection: Unremarkable  Motion: Flexion 45 deg, Extension 45 deg, Side Bending to 45 deg bilaterally,  Rotation to 45 deg bilaterally  SLR laying: Negative  XSLR laying: Negative  Palpable tenderness: None. FABER: negative. Sensory change: Gross sensation intact to all lumbar and sacral dermatomes.  Reflexes: 2+ at both patellar tendons, 2+ at achilles tendons, Babinski's downgoing.  Strength at foot  Plantar-flexion: 5/5 Dorsi-flexion: 5/5 Eversion: 5/5 Inversion: 5/5  Leg strength  Quad: 5/5 Hamstring: 5/5 Hip flexor: 5/5 Hip abductors: 5/5   Shoulder: Inspection reveals no abnormalities, atrophy or asymmetry. Palpation is normal with no tenderness over AC joint or bicipital groove. ROM is full in all planes. Rotator cuff strength normal throughout. No signs of impingement with negative Neer and Hawkin's tests, empty can sign. Speeds and Yergason's tests normal. No labral pathology noted with negative Obrien's, negative clunk and good stability. Normal scapular function observed. No painful arc and no drop arm sign. No apprehension sign   [facility] US performed of: left shoulder This study was ordered, performed, and interpreted by Terrilee Files D.O.  Shoulder:   Supraspinatus:  Appears normal on long and transverse views, no bursal bulge seen with shoulder abduction on impingement view. Bursa is inflamed and large Infraspinatus:  Appears normal on long and transverse views. Bursa is inflamed and lower Subscapularis:  Appears normal on long and transverse  views. Teres Minor:  Appears normal on long and transverse views. AC joint:  Capsule undistended, no geyser sign. Glenohumeral  Joint:  Appears normal without effusion. Glenoid Labrum:  Intact without visualized tears. Biceps Tendon:  Appears normal on long and transverse views, no fraying of tendon, tendon located in intertubercular groove, no subluxation with shoulder internal or external rotation. No increased power doppler signal. Impression: Subacromial bursitis  Procedure: Real-time Ultrasound Guided Injection of left glenohumeral joint Device: GE Logiq E  Ultrasound guided injection is preferred based studies that show increased duration, increased effect, greater accuracy, decreased procedural pain, increased response rate with ultrasound guided versus blind injection.  Verbal informed consent obtained.  Time-out conducted.  Noted no overlying erythema, induration, or other signs of local infection.  Skin prepped in a sterile fashion.  Local anesthesia: Topical Ethyl chloride.  With sterile technique and under real time ultrasound guidance:  Joint visualized.  23g 1  inch needle inserted posterior approach. Pictures taken for needle placement. Patient did have injection of 2 cc of 1% lidocaine, 2 cc of 0.5% Marcaine, and 1cc of Kenalog 40 mg/dL. Completed without difficulty  Pain immediately resolved suggesting accurate placement of the medication.  Advised to call if fevers/chills, erythema, induration, drainage, or persistent bleeding.  Images permanently stored and available for review in the ultrasound unit.  Impression: Technically successful ultrasound guided injection.   OMT Physical Exam  Cervical  C2 RS left   Thoracic T7 extended rotated and side bent right T5 extended rotated and side bent left  Lumbar L2 F RS left  Sacrum Left on left  Illium neutral

## 2013-07-10 NOTE — Progress Notes (Signed)
Pre-visit discussion using our clinic review tool. No additional management support is needed unless otherwise documented below in the visit note.  

## 2013-07-18 ENCOUNTER — Other Ambulatory Visit: Payer: Self-pay | Admitting: Obstetrics and Gynecology

## 2013-07-20 ENCOUNTER — Encounter (HOSPITAL_COMMUNITY): Payer: Self-pay

## 2013-07-23 NOTE — Progress Notes (Signed)
Shanelle from Dr Cherly Hensenousins office called.  Dr Cherly Hensenousins wants patient to see Anesthesia at PAT appt.

## 2013-07-24 ENCOUNTER — Other Ambulatory Visit: Payer: Self-pay | Admitting: Sports Medicine

## 2013-07-24 DIAGNOSIS — M84369A Stress fracture, unspecified tibia and fibula, initial encounter for fracture: Secondary | ICD-10-CM

## 2013-07-25 ENCOUNTER — Inpatient Hospital Stay (HOSPITAL_COMMUNITY)
Admission: RE | Admit: 2013-07-25 | Discharge: 2013-07-25 | Disposition: A | Discharge status: Home / Self Care | Payer: BC Managed Care – PPO | Source: Ambulatory Visit

## 2013-07-30 ENCOUNTER — Ambulatory Visit: Payer: BC Managed Care – PPO | Admitting: Family Medicine

## 2013-08-06 ENCOUNTER — Ambulatory Visit (INDEPENDENT_AMBULATORY_CARE_PROVIDER_SITE_OTHER): Payer: BC Managed Care – PPO | Admitting: Family Medicine

## 2013-08-06 ENCOUNTER — Encounter: Payer: Self-pay | Admitting: Family Medicine

## 2013-08-06 VITALS — BP 120/64 | HR 78 | Temp 98.2°F | Resp 16 | Wt 120.1 lb

## 2013-08-06 DIAGNOSIS — M9981 Other biomechanical lesions of cervical region: Secondary | ICD-10-CM

## 2013-08-06 DIAGNOSIS — M542 Cervicalgia: Secondary | ICD-10-CM

## 2013-08-06 DIAGNOSIS — M999 Biomechanical lesion, unspecified: Secondary | ICD-10-CM

## 2013-08-06 NOTE — Assessment & Plan Note (Signed)
After verbal consent patient was treated with  ME, fpr techniques in cervical, thoracic and lumbar and sacral areas  Patient tolerated the procedure well with improvement in symptoms  Patient given exercises, stretches and lifestyle modifications  See medications in patient instructions if given  Patient will follow up in 3 weeks  Spent greater than 25 minutes with patient face-to-face and had greater than 50% of counseling including as described above in assessment and plan.   

## 2013-08-06 NOTE — Progress Notes (Signed)
Pre-visit discussion using our clinic review tool. No additional management support is needed unless otherwise documented below in the visit note.  

## 2013-08-06 NOTE — Patient Instructions (Signed)
Good to see you Start ROM exercises for ankle  Continue the boot and keep me updated We can do the muscle glycogen levels when you get training.  Denice ParadiseAaron [place]. [phone] e you again in 3-4 weeks.

## 2013-08-06 NOTE — Progress Notes (Signed)
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 P patient is still walking around in her Cam Walker. This does cause her to have more back pain as well as hamstring tightness. Patient has been doing the exercises but not as much as she would like to. [facility] and is scheduled for a CT scan next week. No new symptoms in the back this unfortunately exacerbation of her regular back muscles.  Patient also had left shoulder pain at last visit. Ultrasound did show that patient did have a subacromial bursitis the patient is likely having secondary to continue to be in the boot and walking with crutches. We did give her a steroid injection at last exam given home exercise program. Patient states that pain is completely resolved at this time.   Significant past medical history Patient does have a cervical neck MRI back in June 2013 that showed stable left-sided spurring changes and a shallow disc protrusion at C4-5 with mild left foraminal stenosis. She also had degenerative disc disease at C5-C6.  ROS: Denies fever, chills, nausea vomiting abdominal pain, dysuria, chest pain, shortness of breath dyspnea on exertion or numbness in extremities  Past medical history, social, surgical and family history all reviewed.     Objective: Blood pressure 120/64, pulse 78, temperature 98.2 F (36.8 C), temperature source Oral, resp. rate 16, weight 120 lb 1.9 oz (54.486 kg), SpO2 96.00%. General: No apparent distress alert and oriented x3 mood and affect normal Respiratory: Patient's speak in full sentences and does not appear short of breath Cardiovascular: No lower extremity edema Abdomen soft nontender Skin: Warm dry intact with no signs of infection or rash Neuro: Cranial nerves II through XII are intact, neurovascularly intact in all extremities with 2+ DTRs and 2+  pulses. Neck: Inspection unremarkable. No palpable stepoffs. Negative Spurling's maneuver. Full neck range of motion Grip strength and sensation normal in bilateral hands Strength good C4 to T1 distribution Mild decreased sensory of the C5 distribution on the left side no weakness no. Negative Hoffman sign bilaterally Reflexes normal Back Exam:  Inspection: Unremarkable  Motion: Flexion 45 deg, Extension 45 deg, Side Bending to 45 deg bilaterally,  Rotation to 45 deg bilaterally  SLR laying: Negative  XSLR laying: Negative  Palpable tenderness: Mild over the L1 on the right side FABER: negative. Sensory change: Gross sensation intact to all lumbar and sacral dermatomes.  Reflexes: 2+ at both patellar tendons, 2+ at achilles tendons, Babinski's downgoing.  Strength at foot  Plantar-flexion: 5/5 Dorsi-flexion: 5/5 Eversion: 5/5 Inversion: 5/5  Leg strength  Quad: 5/5 Hamstring: 5/5 Hip flexor: 5/5 Hip abductors: 5/5    OMT Physical Exam  Cervical  C2 RS left   Thoracic T7 extended rotated and side bent right T5 extended rotated and side bent left  Lumbar L1 F RS right  Sacrum Left on left  Illium neutral

## 2013-08-06 NOTE — Assessment & Plan Note (Signed)
After verbal consent patient was treated with  ME, fpr techniques in cervical, thoracic and lumbar and sacral areas  Patient tolerated the procedure well with improvement in symptoms  Patient given exercises, stretches and lifestyle modifications  See medications in patient instructions if given  Patient will follow up in 3 weeks  Spent greater than 25 minutes with patient face-to-face and had greater than 50% of counseling including as described above in assessment and plan.

## 2013-08-06 NOTE — Assessment & Plan Note (Signed)
Patient continues to do fairly well overall. Patient's neck pain is likely be more exacerbated secondary to her abnormal gait secondary to the [employer]. Patient will continue with her exercises. Patient's aches and pains I think she would be a good candidate for muscle glycogen levels and starting to her training again.

## 2013-08-13 ENCOUNTER — Ambulatory Visit
Admission: RE | Admit: 2013-08-13 | Discharge: 2013-08-13 | Disposition: A | Discharge status: Home / Self Care | Payer: BC Managed Care – PPO | Source: Ambulatory Visit | Attending: Sports Medicine | Admitting: Sports Medicine

## 2013-08-13 DIAGNOSIS — M84369A Stress fracture, unspecified tibia and fibula, initial encounter for fracture: Secondary | ICD-10-CM

## 2013-08-15 ENCOUNTER — Ambulatory Visit (HOSPITAL_COMMUNITY)
Admission: RE | Admit: 2013-08-15 | Payer: BC Managed Care – PPO | Source: Ambulatory Visit | Admitting: Obstetrics and Gynecology

## 2013-08-15 ENCOUNTER — Encounter (HOSPITAL_COMMUNITY): Admission: RE | Payer: Self-pay | Source: Ambulatory Visit

## 2013-08-15 SURGERY — DILATATION & CURETTAGE/HYSTEROSCOPY WITH THERMACHOICE ABLATION
Anesthesia: Choice

## 2013-09-03 ENCOUNTER — Ambulatory Visit: Payer: BC Managed Care – PPO | Admitting: Family Medicine

## 2013-09-14 ENCOUNTER — Ambulatory Visit (INDEPENDENT_AMBULATORY_CARE_PROVIDER_SITE_OTHER): Payer: BC Managed Care – PPO | Admitting: Family Medicine

## 2013-09-14 ENCOUNTER — Other Ambulatory Visit (INDEPENDENT_AMBULATORY_CARE_PROVIDER_SITE_OTHER): Payer: BC Managed Care – PPO

## 2013-09-14 ENCOUNTER — Encounter: Payer: Self-pay | Admitting: Family Medicine

## 2013-09-14 VITALS — BP 118/70 | HR 70 | Temp 97.0°F | Resp 16 | Wt 123.1 lb

## 2013-09-14 DIAGNOSIS — M67919 Unspecified disorder of synovium and tendon, unspecified shoulder: Secondary | ICD-10-CM

## 2013-09-14 DIAGNOSIS — M25512 Pain in left shoulder: Secondary | ICD-10-CM

## 2013-09-14 DIAGNOSIS — M25519 Pain in unspecified shoulder: Secondary | ICD-10-CM

## 2013-09-14 DIAGNOSIS — M542 Cervicalgia: Secondary | ICD-10-CM

## 2013-09-14 DIAGNOSIS — M7552 Bursitis of left shoulder: Secondary | ICD-10-CM

## 2013-09-14 DIAGNOSIS — M719 Bursopathy, unspecified: Secondary | ICD-10-CM

## 2013-09-14 DIAGNOSIS — M549 Dorsalgia, unspecified: Secondary | ICD-10-CM

## 2013-09-14 DIAGNOSIS — M9981 Other biomechanical lesions of cervical region: Secondary | ICD-10-CM

## 2013-09-14 DIAGNOSIS — M999 Biomechanical lesion, unspecified: Secondary | ICD-10-CM

## 2013-09-14 NOTE — Assessment & Plan Note (Signed)
After verbal consent patient was treated with  ME, fpr techniques in cervical, thoracic and lumbar and sacral areas  Patient tolerated the procedure well with improvement in symptoms  Patient given exercises, stretches and lifestyle modifications  See medications in patient instructions if given  Patient will follow up in 3 weeks   

## 2013-09-14 NOTE — Assessment & Plan Note (Signed)
After verbal consent patient was treated with  ME, fpr techniques in cervical, thoracic and lumbar and sacral areas  Patient tolerated the procedure well with improvement in symptoms  Patient given exercises, stretches and lifestyle modifications  See medications in patient instructions if given  Patient will follow up in 3 weeks  Spent greater than 25 minutes with patient face-to-face and had greater than 50% of counseling including as described above in assessment and plan.

## 2013-09-14 NOTE — Patient Instructions (Signed)
Good to see you as always Compression of the hamstring.  Iron 325 mg daily. Iron citrate not iron sulfate.  Ice to shoulder 20 minutes 2-3 times a day Vitamin D 2000IU daily.  Come back again in 4 weeks.

## 2013-09-14 NOTE — Progress Notes (Signed)
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 Patient has been having osteopathic manipulation for quite some time and is responding well. Patient is having some mild upper back as well as lower back pain. Patient is starting training now after her stress fracture. Started biking more often and is doing remarkably well. Patient has the same aches and pains and would like further evaluation for possible osteopathic manipulation.  Patient also had left shoulder pain.  Continues to have pain. Patient was doing the stretching feel like the last today she's been having more pain in the shoulder and would like it evaluated him. Denies any radiation of the arm or any numbness. Denies any weakness to. Patient underwent again is currently training for marathons as well as triathlons and we will be in good health.   Significant past medical history Patient does have a cervical neck MRI back in June 2013 that showed stable left-sided spurring changes and a shallow disc protrusion at C4-5 with mild left foraminal stenosis. She also had degenerative disc disease at C5-C6.  ROS: Denies fever, chills, nausea vomiting abdominal pain, dysuria, chest pain, shortness of breath dyspnea on exertion or numbness in extremities  Past medical history, social, surgical and family history all reviewed.     Objective: Blood pressure 118/70, pulse 70, temperature 97 F (36.1 C), temperature source Oral, resp. rate 16, weight 123 lb 1.3 oz (55.829 kg), SpO2 97.00%. General: No apparent distress alert and oriented x3 mood and affect normal Respiratory: Patient's speak in full sentences and does not appear short of breath Cardiovascular: No lower extremity edema Abdomen soft nontender Skin: Warm dry intact with no signs of infection or rash Neuro: Cranial nerves II through XII are intact, neurovascularly intact in all  extremities with 2+ DTRs and 2+ pulses. Neck: Inspection unremarkable. No palpable stepoffs. Negative Spurling's maneuver. Full neck range of motion Grip strength and sensation normal in bilateral hands Strength good C4 to T1 distribution Mild decreased sensory of the C5 distribution on the left side no weakness no. Negative Hoffman sign bilaterally Reflexes normal Back Exam:  Inspection: Unremarkable  Motion: Flexion 45 deg, Extension 45 deg, Side Bending to 45 deg bilaterally,  Rotation to 45 deg bilaterally  SLR laying: Negative  XSLR laying: Negative  Palpable tenderness: Mild over the L1 on the right side FABER: negative. Sensory change: Gross sensation intact to all lumbar and sacral dermatomes.  Reflexes: 2+ at both patellar tendons, 2+ at achilles tendons, Babinski's downgoing.  Strength at foot  Plantar-flexion: 5/5 Dorsi-flexion: 5/5 Eversion: 5/5 Inversion: 5/5  Leg strength  Quad: 5/5 Hamstring: 5/5 Hip flexor: 5/5 Hip abductors: 5/5  Shoulder: Inspection reveals no abnormalities, atrophy or asymmetry. Palpation is normal with no tenderness over AC joint or bicipital groove. ROM is full in all planes. Rotator cuff strength normal throughout. signs of impingement with Positive Neer and Hawkin's tests, empty can sign. Speeds and Yergason's tests normal. No labral pathology noted with negative Obrien's, negative clunk and good stability. Normal scapular function observed. No painful arc and no drop arm sign. No apprehension sign  MSK US performed of: Right shoulder This study was ordered, performed, and interpreted by Terrilee Files D.O.  Shoulder:   Supraspinatus:  Appears normal on long and transverse views, no bursal bulge seen with shoulder abduction on impingement view. Infraspinatus:  Appears normal on long and transverse views. Subscapularis:  Appears normal on  long and transverse views. Teres Minor:  Appears normal on long and transverse views. AC joint:   Capsule undistended, no geyser sign. Glenohumeral Joint:  Appears normal without effusion. Glenoid Labrum:  Intact without visualized tears. Biceps Tendon:  Appears normal on long and transverse views, no fraying of tendon, tendon located in intertubercular groove, no subluxation with shoulder internal or external rotation. No increased power doppler signal.  Impression: Normal shoulder  OMT Physical Exam  Cervical  C2 RS left   Thoracic T7 extended rotated and side bent right T5 extended rotated and side bent left  Lumbar L2 F RS right  Sacrum Left on left  Illium neutral

## 2013-09-14 NOTE — Progress Notes (Signed)
Pre visit review using our clinic review tool, if applicable. No additional management support is needed unless otherwise documented below in the visit note. 

## 2013-09-14 NOTE — Assessment & Plan Note (Signed)
Patient's imaging is unremarkable nothing patient does have some of a strain muscle. Patient does have a pinched nerve in the neck that could be contributing but I do not think will change the management. Patient then come back again in 4 weeks for further evaluation.

## 2013-09-14 NOTE — Assessment & Plan Note (Signed)
Patient continues to do well overall. Patient denies any radiation of laxity numbness. Patient is responding to osteopathic manipulation and will continue her regular basis. Patient will follow up again in 4 weeks for further evaluation and treatment.

## 2013-09-14 NOTE — Assessment & Plan Note (Signed)
After verbal consent patient was treated with  ME, fpr techniques in cervical, thoracic and lumbar and sacral areas  Patient tolerated the procedure well with improvement in symptoms  Patient given exercises, stretches and lifestyle modifications  See medications in patient instructions if given  Patient will follow up in 3 weeks

## 2013-09-20 ENCOUNTER — Other Ambulatory Visit: Payer: Self-pay | Admitting: Family Medicine

## 2013-09-25 NOTE — Telephone Encounter (Signed)
Refill done.  

## 2013-10-15 ENCOUNTER — Encounter: Payer: Self-pay | Admitting: Family Medicine

## 2013-10-15 ENCOUNTER — Ambulatory Visit (INDEPENDENT_AMBULATORY_CARE_PROVIDER_SITE_OTHER): Payer: BC Managed Care – PPO | Admitting: Family Medicine

## 2013-10-15 VITALS — BP 104/76 | HR 78

## 2013-10-15 DIAGNOSIS — M24559 Contracture, unspecified hip: Secondary | ICD-10-CM

## 2013-10-15 DIAGNOSIS — M549 Dorsalgia, unspecified: Secondary | ICD-10-CM

## 2013-10-15 DIAGNOSIS — M9981 Other biomechanical lesions of cervical region: Secondary | ICD-10-CM

## 2013-10-15 DIAGNOSIS — S86899A Other injury of other muscle(s) and tendon(s) at lower leg level, unspecified leg, initial encounter: Secondary | ICD-10-CM

## 2013-10-15 DIAGNOSIS — M999 Biomechanical lesion, unspecified: Secondary | ICD-10-CM

## 2013-10-15 DIAGNOSIS — M6240 Contracture of muscle, unspecified site: Secondary | ICD-10-CM

## 2013-10-15 DIAGNOSIS — M542 Cervicalgia: Secondary | ICD-10-CM

## 2013-10-15 NOTE — Patient Instructions (Signed)
Good to see you Do exercises for shoulder and hamstring You are doing great for just doing a race! Lets try the [place] to remove too much impact on the leg. We will make you orthotics and I should have them in by Friday.  See you then.

## 2013-10-15 NOTE — Assessment & Plan Note (Signed)
This and has a history of the bulging disc and we'll continue the home exercises 3 times a week. Patient will limit the amount of lifting over her head and we did give her posture exercises that could be beneficial. Patient and will come back again in 3-4 weeks for more manipulation therapy.

## 2013-10-15 NOTE — Assessment & Plan Note (Signed)
I am concerned patient's past medical history that she is still at increased risk of having another tibial stress fracture. I would like to get patient into new custom orthotics that would fit in her running shoes. These will be ordered and she'll come back again in 15 days and we will do custom orthotics.

## 2013-10-15 NOTE — Assessment & Plan Note (Signed)
Discussed with patient again at the importance of stretching. Patient given strengthening exercises for her hip strengthening I will relieve some of the stress on the hip flexors.

## 2013-10-15 NOTE — Progress Notes (Signed)
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 Patient has been having osteopathic manipulation for quite some time and is responding well. Patient is having some mild upper back as well as lower back pain. Patient did just completed her first triathlon in a year did not get a running portion. Patient states that she has been doing better though. Patient states that she has noticed her hip flexor is much more tight now after the race but overall feeling good. Patient has had stress fractures of the tibias previously but states that she is also feeling much better.     Significant past medical history Patient does have a cervical neck MRI back in June 2013 that showed stable left-sided spurring changes and a shallow disc protrusion at C4-5 with mild left foraminal stenosis. She also had degenerative disc disease at C5-C6.  ROS: Denies fever, chills, nausea vomiting abdominal pain, dysuria, chest pain, shortness of breath dyspnea on exertion or numbness in extremities  Past medical history, social, surgical and family history all reviewed.     Objective: Blood pressure 104/76, pulse 78, SpO2 99.00%. General: No apparent distress alert and oriented x3 mood and affect normal Respiratory: Patient's speak in full sentences and does not appear short of breath Cardiovascular: No lower extremity edema Abdomen soft nontender Skin: Warm dry intact with no signs of infection or rash Neuro: Cranial nerves II through XII are intact, neurovascularly intact in all extremities with 2+ DTRs and 2+ pulses. Neck: Inspection unremarkable. No palpable stepoffs. Negative Spurling's maneuver. Full neck range of motion Grip strength and sensation normal in bilateral hands Strength good C4 to T1 distribution Mild decreased sensory of the C5 distribution on the left side no weakness no. Negative Hoffman sign  bilaterally Reflexes normal Back Exam:  Inspection: Unremarkable  Motion: Flexion 45 deg, Extension 45 deg, Side Bending to 45 deg bilaterally,  Rotation to 45 deg bilaterally  SLR laying: Negative  XSLR laying: Negative  Palpable tenderness: Mild over the L1 on the right side with significant tightness of the hip flexors bilaterally FABER: negative. Sensory change: Gross sensation intact to all lumbar and sacral dermatomes.  Reflexes: 2+ at both patellar tendons, 2+ at achilles tendons, Babinski's downgoing.  Strength at foot  Plantar-flexion: 5/5 Dorsi-flexion: 5/5 Eversion: 5/5 Inversion: 5/5  Leg strength  Quad: 5/5 Hamstring: 5/5 Hip flexor: 5/5 Hip abductors: 5/5  r  OMT Physical Exam  Cervical  C2 RS left   Thoracic T7 extended rotated and side bent right T5 extended rotated and side bent left  Lumbar L2 F RS right  Sacrum Left on left  Illium neutral   Spent greater than 25 minutes with patient face-to-face and had greater than 50% of counseling including as described above in assessment and plan.

## 2013-10-15 NOTE — Assessment & Plan Note (Signed)
After verbal consent patient was treated with  ME, fpr techniques in cervical, thoracic and lumbar and sacral areas  Patient tolerated the procedure well with improvement in symptoms  Patient given exercises, stretches and lifestyle modifications  See medications in patient instructions if given  Patient will follow up in 3-4 weeks

## 2013-10-26 ENCOUNTER — Ambulatory Visit (INDEPENDENT_AMBULATORY_CARE_PROVIDER_SITE_OTHER): Payer: BC Managed Care – PPO | Admitting: Family Medicine

## 2013-10-26 ENCOUNTER — Encounter: Payer: Self-pay | Admitting: Family Medicine

## 2013-10-26 VITALS — BP 94/60 | HR 76

## 2013-10-26 DIAGNOSIS — S86899A Other injury of other muscle(s) and tendon(s) at lower leg level, unspecified leg, initial encounter: Secondary | ICD-10-CM

## 2013-10-26 DIAGNOSIS — IMO0002 Reserved for concepts with insufficient information to code with codable children: Secondary | ICD-10-CM

## 2013-10-26 NOTE — Patient Instructions (Addendum)
Good to see you as always Wear the orthotics 3 hours first first increase thereafter.

## 2013-10-26 NOTE — Progress Notes (Signed)
Chief complaint: Shin pain  Subjective: Patient has a history of a medial tibial stress fractures. Patient was in a walking boot for 5 months secondary to a stress fracture last year. Patient has started training on a more regular basis and would like further treatment options. Patient continues with a compression sleeve, good running shoes, as well as has custom orthotics but these are greater than 5[address] years old.    Significant past medical history Patient does have a cervical neck MRI back in June 2013 that showed stable left-sided spurring changes and a shallow disc protrusion at C4-5 with mild left foraminal stenosis. She also had degenerative disc disease at C5-C6.  ROS: Denies fever, chills, nausea vomiting abdominal pain, dysuria, chest pain, shortness of breath dyspnea on exertion or numbness in extremities  Past medical history, social, surgical and family history all reviewed.     Objective: Blood pressure 94/60, pulse 76, SpO2 99.00%. General: No apparent distress alert and oriented x3 mood and affect normal Respiratory: Patient's speak in full sentences and does not appear short of breath Cardiovascular: No lower extremity edema Abdomen soft nontender Skin: Warm dry intact with no signs of infection or rash Neuro: Cranial nerves II through XII are intact, neurovascularly intact in all extremities with 2+ DTRs and 2+ pulses. Neck: Inspection unremarkable. No palpable stepoffs. Negative Spurling's maneuver. Full neck range of motion Grip strength and sensation normal in bilateral hands Strength good C4 to T1 distribution Mild decreased sensory of the C5 distribution on the left side no weakness no. Negative Hoffman sign bilaterally Reflexes normal Back Exam:  Inspection: Unremarkable  Motion: Flexion 45 deg, Extension 45 deg, Side Bending to 45 deg bilaterally,  Rotation to 45 deg bilaterally  SLR laying: Negative  XSLR laying: Negative  Palpable tenderness: Mild over  the L1 on the right side with significant tightness of the hip flexors bilaterally FABER: negative. Sensory change: Gross sensation intact to all lumbar and sacral dermatomes.  Reflexes: 2+ at both patellar tendons, 2+ at achilles tendons, Babinski's downgoing.  Strength at foot  Plantar-flexion: 5/5 Dorsi-flexion: 5/5 Eversion: 5/5 Inversion: 5/5  Leg strength  Quad: 5/5 Hamstring: 5/5 Hip flexor: 5/5 Hip abductors: 5/5  Foot exam Patient does have a fairly neutral foot pes cavus. She did note Patient was fitted for a : standard, cushioned, semi-rigid orthotic. The orthotic was heated and afterward the patient stood on the orthotic blank positioned on the orthotic stand. The patient was positioned in subtalar neutral position and 10 degrees of ankle dorsiflexion in a weight bearing stance. After completion of molding, a stable base was applied to the orthotic blank. The blank was ground to a stable position for weight bearing. Size: 9 Base: Blue EVA Additional Posting and Padding: None The patient ambulated these, and they were very comfortable.  I spent 45 minutes with this patient, greater than 50% was face-to-face time counseling regarding the below diagnosis.

## 2013-10-26 NOTE — Assessment & Plan Note (Signed)
Orthotics made today with patient's maximalist shoes. Patient encouraged to continue the exercises 2 times a week Discussed monitoring any type of discomfort to try to decrease the likelihood of recurrent injury to Continue vitamin D and iron supplementation Patient will come back again in 2 weeks to make sure orthotics are fitting comfortably.

## 2013-11-19 ENCOUNTER — Encounter: Payer: Self-pay | Admitting: Family Medicine

## 2013-11-19 ENCOUNTER — Ambulatory Visit (INDEPENDENT_AMBULATORY_CARE_PROVIDER_SITE_OTHER): Payer: BC Managed Care – PPO | Admitting: Family Medicine

## 2013-11-19 VITALS — BP 102/68 | HR 78 | Ht 65.0 in | Wt 122.0 lb

## 2013-11-19 DIAGNOSIS — S86899A Other injury of other muscle(s) and tendon(s) at lower leg level, unspecified leg, initial encounter: Secondary | ICD-10-CM

## 2013-11-19 DIAGNOSIS — IMO0002 Reserved for concepts with insufficient information to code with codable children: Secondary | ICD-10-CM

## 2013-11-19 NOTE — Progress Notes (Signed)
Chief complaint: Shin pain follow up  Subjective: Patient is here for another pair of orthotics. Patient was seen previously and was given a pair. Patient states that this does not settle her shoes and she would like another pair that is a different size. Patient states that the other one is giving her some significant improvement.   ROS: Denies fever, chills, nausea vomiting abdominal pain, dysuria, chest pain, shortness of breath dyspnea on exertion or numbness in extremities  Past medical history, social, surgical and family history all reviewed.     Objective: Blood pressure 102/68, pulse 78, height 5\' 5"  (1.651 m), weight 122 lb (55.339 kg), SpO2 99.00%. General: No apparent distress alert and oriented x3 mood and affect normal Respiratory: Patient's speak in full sentences and does not appear short of breath Cardiovascular: No lower extremity edema Abdomen soft nontender Skin: Warm dry intact with no signs of infection or rash Neuro: Cranial nerves II through XII are intact, neurovascularly intact in all extremities with 2+ DTRs and 2+ pulses. Neck: Inspection unremarkable. No palpable stepoffs. Negative Spurling's maneuver. Full neck range of motion Grip strength and sensation normal in bilateral hands Strength good C4 to T1 distribution Mild decreased sensory of the C5 distribution on the left side no weakness no. Negative Hoffman sign bilaterally Reflexes normal Back Exam:  Inspection: Unremarkable  Motion: Flexion 45 deg, Extension 45 deg, Side Bending to 45 deg bilaterally,  Rotation to 45 deg bilaterally  SLR laying: Negative  XSLR laying: Negative  Palpable tenderness: Mild over the L1 on the right side with significant tightness of the hip flexors bilaterally FABER: negative. Sensory change: Gross sensation intact to all lumbar and sacral dermatomes.  Reflexes: 2+ at both patellar tendons, 2+ at achilles tendons, Babinski's downgoing.  Strength at foot   Plantar-flexion: 5/5 Dorsi-flexion: 5/5 Eversion: 5/5 Inversion: 5/5  Leg strength  Quad: 5/5 Hamstring: 5/5 Hip flexor: 5/5 Hip abductors: 5/5  Foot exam Patient does have a fairly neutral foot pes cavus.   Procedure note.  Patient was fitted for a : standard, cushioned, semi-rigid orthotic. The orthotic was heated and afterward the patient stood on the orthotic blank positioned on the orthotic stand. The patient was positioned in subtalar neutral position and 10 degrees of ankle dorsiflexion in a weight bearing stance. After completion of molding, a stable base was applied to the orthotic blank. The blank was ground to a stable position for weight bearing. Size: 9 Base: Blue EVA Additional Posting and Padding: None The patient ambulated these, and they were very comfortable.  I spent 45 minutes with this patient, greater than 50% was face-to-face time counseling regarding the below diagnosis.

## 2013-11-19 NOTE — Patient Instructions (Signed)
Good to see you I will call you with the orthotics Focus on hip abductors Try piriformis. Stretch and the other one on the wall.  See me when you need me.

## 2013-11-19 NOTE — Assessment & Plan Note (Signed)
History of a healed tibial stress fracture that I like to avoid. Patient has had 2 consecutive ones during her training regimen over the course last 2 years. Patient is aware maximal shoes and given exercises. Patient will continue the exercises. Patient will come back again in 3 weeks for further manipulation therapy.

## 2013-12-14 ENCOUNTER — Other Ambulatory Visit: Payer: Self-pay | Admitting: Obstetrics and Gynecology

## 2013-12-19 ENCOUNTER — Encounter: Payer: Self-pay | Admitting: Family Medicine

## 2013-12-19 ENCOUNTER — Ambulatory Visit (INDEPENDENT_AMBULATORY_CARE_PROVIDER_SITE_OTHER): Payer: BC Managed Care – PPO | Admitting: Family Medicine

## 2013-12-19 ENCOUNTER — Other Ambulatory Visit: Payer: Self-pay | Admitting: Family Medicine

## 2013-12-19 VITALS — BP 114/78 | HR 74 | Ht 66.0 in | Wt 122.0 lb

## 2013-12-19 DIAGNOSIS — M629 Disorder of muscle, unspecified: Secondary | ICD-10-CM

## 2013-12-19 DIAGNOSIS — M2420 Disorder of ligament, unspecified site: Secondary | ICD-10-CM

## 2013-12-19 DIAGNOSIS — M76829 Posterior tibial tendinitis, unspecified leg: Secondary | ICD-10-CM | POA: Insufficient documentation

## 2013-12-19 NOTE — Patient Instructions (Signed)
Good to see you Warm liquids will be good.  You are going do great Lots of carbs next 2 days and cut training by 50%  Wear the compression, I think the tighter the better Keep me updated.

## 2013-12-19 NOTE — Assessment & Plan Note (Signed)
I believe patient has more of a dysfunction secondary to the muscle strain. I think it's more of a fascial irritation near the proximal aspect. We discussed icing and patient was given a new compression sleeve that think will be beneficial. Discuss stretches it to be beneficial as well. We also discussed that this could be another overuse injury and patient is to increase her glycogen. We discussed about during exercise and after exercise and the importance. Patient will come back after her triathlon for manipulation and further evaluation.  Spent greater than 25 minutes with patient face-to-face and had greater than 50% of counseling including as described above in assessment and plan.

## 2013-12-19 NOTE — Progress Notes (Signed)
Stacy Terrell D.O. [facility] [address] [place], [place] [address] Phone: [phone] Subjective:      CC: Left calf pain  NGE:[id number]:Subjective Stacy Terrell is a 48 y.o. female coming in with complaint of left calf pain. Patient does have past medical history significant for medial tibial stress fractures previously. Patient went to a running class and tried to change her gait. Patient unfortunately he states that all this did does give her some more discomfort on the inner part of her ankle. Patient denies any swelling or any bruising. Patient states it is just sore overall. Patient has tried compression as well as ice with minimal improvement. Patient does have a very important triathlon coming up this weekend. Patient wants to make sure that she is able to run and competes regularly. Denies any radiation of pain or any numbness. Patient rates the severity of 6/10. Patient is able to ambulate and is able to sleep comfortably.     Past medical history, social, surgical and family history all reviewed in electronic medical record.   Review of Systems: No headache, visual changes, nausea, vomiting, diarrhea, constipation, dizziness, abdominal pain, skin rash, fevers, chills, night sweats, weight loss, swollen lymph nodes, body aches, joint swelling, muscle aches, chest pain, shortness of breath, mood changes.   Objective Blood pressure 114/78, pulse 74, height 5\' 6"  (1.676 m), weight 122 lb (55.339 kg), SpO2 98.00%.  General: No apparent distress alert and oriented x3 mood and affect normal, dressed appropriately.  HEENT: Pupils equal, extraocular movements intact  Respiratory: Patient's speak in full sentences and does not appear short of breath  Cardiovascular: No lower extremity edema, non tender, no erythema  Skin: Warm dry intact with no signs of infection or rash on extremities or on axial skeleton.  Abdomen: Soft nontender  Neuro: Cranial nerves II through XII are intact,  neurovascularly intact in all extremities with 2+ DTRs and 2+ pulses.  Lymph: No lymphadenopathy of posterior or anterior cervical chain or axillae bilaterally.  Gait normal with good balance and coordination.  MSK:  Non tender with full range of motion and good stability and symmetric strength and tone of shoulders, elbows, wrist, hip, knees bilaterally.  Ankle: Left No visible erythema or swelling. Range of motion is full in all directions. Strength is 5/5 in all directions. Stable lateral and medial ligaments; squeeze test and kleiger test unremarkable; Talar dome nontender; No pain at base of 5th MT; No tenderness over cuboid; No tenderness over N spot or navicular prominence No tenderness on posterior aspects of lateral and medial malleolus No sign of peroneal tendon subluxations or tenderness to palpation Negative tarsal tunnel tinel's Patient though it is tender to palpation at the posterior tibialis insertion near the gastrocnemius medial head and soleus muscle. Able to walk 4 steps.  MSK US performed of: Left ankle This study was ordered, performed, and interpreted by Terrilee FilesZach Terrell D.O.  Foot/Ankle:   All structures visualized.   Talar dome unremarkable  Ankle mortise without effusion. Peroneus longus and brevis tendons unremarkable on long and transverse views without sheath effusions. Posterior tibialis does have hypoechoic changes surrounding this area it seems to go fairly high. At patient's origin there seems to be signs and some thickening of the fascia but no acute tear appreciated. flexor hallucis longus, and flexor digitorum longus tendons unremarkable on long and transverse views without sheath effusions. Achilles tendon visualized along length of tendon and unremarkable on long and transverse views without sheath effusion. Anterior Talofibular  Ligament and Calcaneofibular Ligaments unremarkable and intact. Deltoid Ligament unremarkable and intact. Plantar fascia intact  and without effusion, normal thickness. No increased doppler signal, cap sign, or thickening of tibial cortex. Power doppler signal normal.  IMPRESSION: Mild posterior tibialis tendinitis and fascial strain     Impression and Recommendations:     This case required medical decision making of moderate complexity.

## 2013-12-27 ENCOUNTER — Other Ambulatory Visit: Payer: Self-pay | Admitting: Obstetrics and Gynecology

## 2013-12-27 NOTE — Telephone Encounter (Signed)
Refill done.  

## 2013-12-28 ENCOUNTER — Encounter (HOSPITAL_COMMUNITY): Payer: Self-pay

## 2014-01-02 ENCOUNTER — Encounter (HOSPITAL_COMMUNITY): Payer: Self-pay

## 2014-01-02 ENCOUNTER — Encounter (HOSPITAL_COMMUNITY)
Admission: RE | Admit: 2014-01-02 | Discharge: 2014-01-02 | Disposition: A | Discharge status: Home / Self Care | Payer: BC Managed Care – PPO | Source: Ambulatory Visit | Attending: Obstetrics and Gynecology | Admitting: Obstetrics and Gynecology

## 2014-01-02 HISTORY — DX: Myoneural disorder, unspecified: G70.9

## 2014-01-02 LAB — CBC
HCT: 39.8 % (ref 36.0–46.0)
HEMOGLOBIN: 13.3 g/dL (ref 12.0–15.0)
MCH: 31.7 pg (ref 26.0–34.0)
MCHC: 33.4 g/dL (ref 30.0–36.0)
MCV: 95 fL (ref 78.0–100.0)
PLATELETS: 265 10*3/uL (ref 150–400)
RBC: 4.19 MIL/uL (ref 3.87–5.11)
RDW: 13.1 % (ref 11.5–15.5)
WBC: 6.6 10*3/uL (ref 4.0–10.5)

## 2014-01-02 NOTE — Patient Instructions (Signed)
21 Stacy Terrell  01/02/2014   Your procedure is scheduled on:  01/03/14  [facility].  Pick up the phone at the desk and dial [phone].   Call this number if you have problems the morning of surgery: [phone]   Remember:   Do not eat food:After Midnight.  Do not drink clear liquids: After Midnight.  Take these medicines the morning of surgery with A SIP OF WATER: NA   Do not wear jewelry, make-up or nail polish.  Do not wear lotions, powders, or perfumes. You may wear deodorant.  Do not shave 48 hours prior to surgery.  Do not bring valuables to the hospital.  [facility] is not   responsible for any belongings or valuables brought to the hospital.  Contacts, dentures or bridgework may not be worn into surgery.  Leave suitcase in the car. After surgery it may be brought to your room.  For patients admitted to the hospital, checkout time is 11:00 AM the day of              discharge.   Patients discharged the day of surgery will not be allowed to drive             home.  Name and phone number of your driver: NA  Special Instructions:      Please read over the following fact sheets that you were given:   Surgical Site Infection Prevention

## 2014-01-03 ENCOUNTER — Ambulatory Visit (HOSPITAL_COMMUNITY)
Admission: RE | Admit: 2014-01-03 | Payer: BC Managed Care – PPO | Source: Ambulatory Visit | Admitting: Obstetrics and Gynecology

## 2014-01-03 ENCOUNTER — Ambulatory Visit (HOSPITAL_COMMUNITY)
Admission: RE | Admit: 2014-01-03 | Discharge: 2014-01-03 | Disposition: A | Discharge status: Home / Self Care | Payer: BC Managed Care – PPO | Source: Ambulatory Visit | Attending: Obstetrics and Gynecology | Admitting: Obstetrics and Gynecology

## 2014-01-03 ENCOUNTER — Encounter (HOSPITAL_COMMUNITY): Admission: RE | Payer: Self-pay | Source: Ambulatory Visit

## 2014-01-03 ENCOUNTER — Encounter (HOSPITAL_COMMUNITY)
Admission: RE | Disposition: A | Discharge status: Home / Self Care | Payer: Self-pay | Source: Ambulatory Visit | Attending: Obstetrics and Gynecology

## 2014-01-03 ENCOUNTER — Ambulatory Visit (HOSPITAL_COMMUNITY): Payer: BC Managed Care – PPO | Admitting: Anesthesiology

## 2014-01-03 ENCOUNTER — Encounter (HOSPITAL_COMMUNITY): Payer: BC Managed Care – PPO | Admitting: Anesthesiology

## 2014-01-03 DIAGNOSIS — G709 Myoneural disorder, unspecified: Secondary | ICD-10-CM | POA: Insufficient documentation

## 2014-01-03 DIAGNOSIS — Z87891 Personal history of nicotine dependence: Secondary | ICD-10-CM | POA: Insufficient documentation

## 2014-01-03 DIAGNOSIS — N924 Excessive bleeding in the premenopausal period: Secondary | ICD-10-CM

## 2014-01-03 DIAGNOSIS — N920 Excessive and frequent menstruation with regular cycle: Secondary | ICD-10-CM | POA: Insufficient documentation

## 2014-01-03 HISTORY — PX: DILITATION & CURRETTAGE/HYSTROSCOPY WITH HYDROTHERMAL ABLATION: SHX5570

## 2014-01-03 SURGERY — DILATATION & CURETTAGE/HYSTEROSCOPY WITH THERMACHOICE ABLATION
Anesthesia: Choice

## 2014-01-03 SURGERY — DILATATION & CURETTAGE/HYSTEROSCOPY WITH HYDROTHERMAL ABLATION
Anesthesia: General | Site: Vagina

## 2014-01-03 MED ORDER — FENTANYL CITRATE 0.05 MG/ML IJ SOLN
INTRAMUSCULAR | Status: AC
  Administered 2014-01-03: 50 ug via INTRAVENOUS
  Filled 2014-01-03: qty 2

## 2014-01-03 MED ORDER — LIDOCAINE HCL (CARDIAC) 20 MG/ML IV SOLN
INTRAVENOUS | Status: AC
Start: 2014-01-03 — End: 2014-01-03
  Filled 2014-01-03: qty 5

## 2014-01-03 MED ORDER — HYDROCODONE-ACETAMINOPHEN 5-500 MG PO TABS: 1.0000 | ORAL_TABLET | Freq: Four times a day (QID) | ORAL | Status: AC | PRN

## 2014-01-03 MED ORDER — CHLOROPROCAINE HCL 1 % IJ SOLN
INTRAMUSCULAR | Status: AC
  Filled 2014-01-03: qty 30

## 2014-01-03 MED ORDER — MEPERIDINE HCL 25 MG/ML IJ SOLN: 6.2500 mg | INTRAMUSCULAR | Status: DC | PRN

## 2014-01-03 MED ORDER — FENTANYL CITRATE 0.05 MG/ML IJ SOLN
INTRAMUSCULAR | Status: AC
  Filled 2014-01-03: qty 5

## 2014-01-03 MED ORDER — HYDROCODONE-ACETAMINOPHEN 5-325 MG PO TABS
1.0000 | ORAL_TABLET | Freq: Once | ORAL | Status: AC
  Administered 2014-01-03: 1 via ORAL

## 2014-01-03 MED ORDER — LIDOCAINE HCL (CARDIAC) 20 MG/ML IV SOLN
INTRAVENOUS | Status: DC | PRN
  Administered 2014-01-03: 100 mg via INTRAVENOUS

## 2014-01-03 MED ORDER — HYDROCODONE-ACETAMINOPHEN 5-325 MG PO TABS
ORAL_TABLET | ORAL | Status: AC
  Filled 2014-01-03: qty 1

## 2014-01-03 MED ORDER — FENTANYL CITRATE 0.05 MG/ML IJ SOLN
25.0000 ug | INTRAMUSCULAR | Status: DC | PRN
  Administered 2014-01-03 (×2): 50 ug via INTRAVENOUS

## 2014-01-03 MED ORDER — ONDANSETRON HCL 4 MG/2ML IJ SOLN
INTRAMUSCULAR | Status: AC
Start: 2014-01-03 — End: 2014-01-03
  Filled 2014-01-03: qty 2

## 2014-01-03 MED ORDER — PROPOFOL 10 MG/ML IV BOLUS
INTRAVENOUS | Status: DC | PRN
  Administered 2014-01-03: 150 mg via INTRAVENOUS

## 2014-01-03 MED ORDER — FENTANYL CITRATE 0.05 MG/ML IJ SOLN
INTRAMUSCULAR | Status: DC | PRN
  Administered 2014-01-03: 100 ug via INTRAVENOUS

## 2014-01-03 MED ORDER — PROPOFOL 10 MG/ML IV EMUL
INTRAVENOUS | Status: AC
  Filled 2014-01-03: qty 20

## 2014-01-03 MED ORDER — MIDAZOLAM HCL 2 MG/2ML IJ SOLN: 0.5000 mg | Freq: Once | INTRAMUSCULAR | Status: DC | PRN

## 2014-01-03 MED ORDER — MIDAZOLAM HCL 2 MG/2ML IJ SOLN
INTRAMUSCULAR | Status: AC
  Filled 2014-01-03: qty 2

## 2014-01-03 MED ORDER — CHLOROPROCAINE HCL 1 % IJ SOLN
INTRAMUSCULAR | Status: DC | PRN
  Administered 2014-01-03: 20 mL

## 2014-01-03 MED ORDER — FENTANYL CITRATE 0.05 MG/ML IJ SOLN: INTRAMUSCULAR | Status: DC | PRN

## 2014-01-03 MED ORDER — LACTATED RINGERS IV SOLN
INTRAVENOUS | Status: DC
  Administered 2014-01-03 (×2): via INTRAVENOUS

## 2014-01-03 MED ORDER — LIDOCAINE HCL 2 % IJ SOLN
INTRAMUSCULAR | Status: AC
  Filled 2014-01-03: qty 20

## 2014-01-03 MED ORDER — ONDANSETRON HCL 4 MG/2ML IJ SOLN
INTRAMUSCULAR | Status: DC | PRN
  Administered 2014-01-03: 4 mg via INTRAVENOUS

## 2014-01-03 MED ORDER — PROMETHAZINE HCL 25 MG/ML IJ SOLN: 6.2500 mg | INTRAMUSCULAR | Status: DC | PRN

## 2014-01-03 MED ORDER — MIDAZOLAM HCL 2 MG/2ML IJ SOLN
INTRAMUSCULAR | Status: DC | PRN
  Administered 2014-01-03: 2 mg via INTRAVENOUS

## 2014-01-03 SURGICAL SUPPLY — 15 items
CATH ROBINSON RED A/P 16FR (CATHETERS) IMPLANT
CLOTH BEACON ORANGE TIMEOUT ST (SAFETY) ×3 IMPLANT
CONTAINER PREFILL 10% NBF 60ML (FORM) ×6 IMPLANT
DRAPE HYSTEROSCOPY (DRAPE) ×3 IMPLANT
DRSG TELFA 3X8 NADH (GAUZE/BANDAGES/DRESSINGS) ×3 IMPLANT
ELECT REM PT RETURN 9FT ADLT (ELECTROSURGICAL)
ELECTRODE REM PT RTRN 9FT ADLT (ELECTROSURGICAL) IMPLANT
GLOVE ECLIPSE 6.5 STRL STRAW (GLOVE) ×3 IMPLANT
GOWN STRL REUS W/TWL LRG LVL3 (GOWN DISPOSABLE) ×9 IMPLANT
NS IRRIG 1000ML POUR BTL (IV SOLUTION) ×3 IMPLANT
PACK VAGINAL MINOR WOMEN LF (CUSTOM PROCEDURE TRAY) ×3 IMPLANT
PAD OB MATERNITY 4.3X12.25 (PERSONAL CARE ITEMS) ×3 IMPLANT
SET GENESYS HTA PROCERVA (MISCELLANEOUS) ×3 IMPLANT
TOWEL OR 17X24 6PK STRL BLUE (TOWEL DISPOSABLE) ×6 IMPLANT
WATER STERILE IRR 1000ML POUR (IV SOLUTION) ×3 IMPLANT

## 2014-01-03 NOTE — Anesthesia Postprocedure Evaluation (Signed)
Anesthesia Post Note  Patient: Stacy Terrell  Procedure(s) Performed: Procedure(s) (LRB): DILATATION & CURETTAGE/HYSTEROSCOPY WITH HYDROTHERMAL ABLATION (N/A)  Anesthesia type: Spinal  Patient location: PACU  Post pain: Pain level controlled  Post assessment: Post-op Vital signs reviewed  Last Vitals:  Filed Vitals:   01/03/14 1300  BP:   Pulse: 63  Temp:   Resp: 15    Post vital signs: Reviewed  Level of consciousness: awake  Complications: No apparent anesthesia complications

## 2014-01-03 NOTE — Op Note (Signed)
NAMFara Terrell:  Archila, Roxie                ACCOUNT NO.:  [id number]  MEDICAL RECORD NO.:  [id number]  LOCATION:  [facility]                          FACILITY:  [facility]  PHYSICIAN:  Maxie BetterSheronette Gianna Calef, M.D.DATE OF BIRTH:  1965-09-11  DATE OF PROCEDURE:  01/03/2014 DATE OF DISCHARGE:  01/03/2014                              OPERATIVE REPORT   PREOPERATIVE DIAGNOSIS:  Menorrhagia, failed attempted NovaSure endometrial ablation.  PROCEDURE:  Diagnostic hysteroscopy, Endometrial ablation using the hydrothermablation apparatus.  PREOPERATIVE DIAGNOSIS:  Menorrhagia.  ANESTHESIA:  General, paracervical block.  SURGEON:  Maxie BetterSheronette Saloni Lablanc, M.D.  ASSISTANT:  None.  DESCRIPTION OF PROCEDURE:  Under adequate general anesthesia, the patient was placed in the dorsal lithotomy position.  She was sterilely prepped and draped in usual fashion.  Bladder was catheterized with small amount of urine.  Examination under anesthesia revealed a small and axial-to-retroverted uterus/anteverted uterus.  No adnexal masses could be appreciated.  The bivalve speculum was placed in the vagina. The cervix was noted to be very anterior, was grasped with a single- tooth tenaculum.  A 20 mL of 1% Nesacaine was injected paracervically at the 3 and 9 o'clock position.  The cervix was easily dilated up to [facility] dilator.  The HT apparatus was carefully inserted, both tubal ostia could be seen.  The HT ablation was done after priming with blanching entirely of the cavity noted on hysteroscopic inspection.  After cooling down, the apparatus was removed.  All instruments were then removed from the vagina.  SPECIMEN:  None.  ESTIMATED BLOOD LOSS:  Minimal.  COMPLICATIONS:  None.  The patient tolerated the procedure well, was transferred to the recovery room in stable condition.    Maxie BetterSheronette Alonia Dibuono, M.D.    [place]/MEDQ  D:  01/03/2014  T:  01/03/2014  Job:  [id number]

## 2014-01-03 NOTE — Anesthesia Procedure Notes (Signed)
Procedure Name: LMA Insertion Date/Time: 01/03/2014 11:37 AM Performed by: Monchel Pollitt, Jannet AskewHARLESETTA M Pre-anesthesia Checklist: Patient identified, Timeout performed, Emergency Drugs available, Suction available and Patient being monitored Patient Re-evaluated:Patient Re-evaluated prior to inductionOxygen Delivery Method: Circle system utilized Preoxygenation: Pre-oxygenation with 100% oxygen Intubation Type: IV induction LMA: LMA inserted LMA Size: 4.0 Number of attempts: 1 ETT to lip (cm): at lip. Dental Injury: Teeth and Oropharynx as per pre-operative assessment

## 2014-01-03 NOTE — Anesthesia Preprocedure Evaluation (Signed)
Anesthesia Evaluation  Patient identified by MRN, date of birth, ID band Patient awake    Reviewed: Allergy & Precautions, H&P , Patient's Chart, lab work & pertinent test results, reviewed documented beta blocker date and time   History of Anesthesia Complications Negative for: history of anesthetic complications  Airway Mallampati: II TM Distance: >3 FB Neck ROM: full    Dental   Pulmonary former smoker,  breath sounds clear to auscultation        Cardiovascular Exercise Tolerance: Good Rhythm:regular Rate:Normal     Neuro/Psych  Neuromuscular disease negative psych ROS   GI/Hepatic   Endo/Other    Renal/GU      Musculoskeletal   Abdominal   Peds  Hematology   Anesthesia Other Findings   Reproductive/Obstetrics                           Anesthesia Physical Anesthesia Plan  ASA: I  Anesthesia Plan: General LMA   Post-op Pain Management:    Induction:   Airway Management Planned:   Additional Equipment:   Intra-op Plan:   Post-operative Plan:   Informed Consent: I have reviewed the patients History and Physical, chart, labs and discussed the procedure including the risks, benefits and alternatives for the proposed anesthesia with the patient or authorized representative who has indicated his/her understanding and acceptance.   Dental Advisory Given  Plan Discussed with: CRNA, Surgeon and Anesthesiologist  Anesthesia Plan Comments:         Anesthesia Quick Evaluation

## 2014-01-03 NOTE — Transfer of Care (Signed)
Immediate Anesthesia Transfer of Care Note  Patient: Stacy Terrell  Procedure(s) Performed: Procedure(s): DILATATION & CURETTAGE/HYSTEROSCOPY WITH HYDROTHERMAL ABLATION (N/A)  Patient Location: PACU  Anesthesia Type:General  Level of Consciousness: awake, alert  and oriented  Airway & Oxygen Therapy: Patient Spontanous Breathing and Patient connected to nasal cannula oxygen  Post-op Assessment: Report given to PACU RN and Post -op Vital signs reviewed and stable  Post vital signs: Reviewed and stable  Complications: No apparent anesthesia complications

## 2014-01-03 NOTE — Discharge Instructions (Signed)
CALL  IF TEMP>100.4, NOTHING PER VAGINA X 1 WK, CALL IF SOAKING A MAXI  PAD EVERY HOUR OR MORE FREQUENTLYDISCHARGE INSTRUCTIONS: HYSTEROSCOPY / ENDOMETRIAL ABLATION °The following instructions have been prepared to help you care for yourself upon your return home. ° °Personal hygiene: °• Use sanitary pads for vaginal drainage, not tampons. °• Shower the day after your procedure. °• NO tub baths, pools or Jacuzzis for 2-3 weeks. °• Wipe front to back after using the bathroom. ° °Activity and limitations: °• Do NOT drive or operate any equipment for 24 hours. The effects of anesthesia are still present °and drowsiness may result. °• Do NOT rest in bed all day. °• Walking is encouraged. °• Walk up and down stairs slowly. °• You may resume your normal activity in one to two days or as indicated by your physician. °Sexual activity: NO intercourse for at least 2 weeks after the procedure, or as indicated by your °Doctor. ° °Diet: Eat a light meal as desired this evening. You may resume your usual diet tomorrow. ° °Return to Work: You may resume your work activities in one to two days or as indicated by your °Doctor. ° °What to expect after your surgery: Expect to have vaginal bleeding/discharge for 2-3 days and °spotting for up to 10 days. It is not unusual to have soreness for up to 1-2 weeks. You may have a °slight burning sensation when you urinate for the first day. Mild cramps may continue for a couple of °days. You may have a regular period in 2-6 weeks. ° °Call your doctor for any of the following: °• Excessive vaginal bleeding or clotting, saturating and changing one pad every hour. °• Inability to urinate 6 hours after discharge from hospital. °• Pain not relieved by pain medication. °• Fever of 100.4° F or greater. °• Unusual vaginal discharge or odor. ° °Return to office _________________Call for an appointment ___________________ °Patient’s signature: ______________________ °Nurse’s signature  ________________________ ° °Post Anesthesia Care Unit [phone] °

## 2014-01-03 NOTE — Brief Op Note (Signed)
01/03/2014  12:24 PM  PATIENT:  Stacy Terrell  48 y.o. female  PRE-OPERATIVE DIAGNOSIS:  Menorrhagia, failed attempted novasure endometrial ablation  POST-OPERATIVE DIAGNOSIS:  Menorrhagia, failed novasure endometrial ablation  PROCEDURE:  Endometrial ablation with HTA  SURGEON:  Surgeon(s) and Role:    * Maribelle Hopple Cathie BeamsA Vicktoria Muckey, MD - Primary  PHYSICIAN ASSISTANT:   ASSISTANTS: none   ANESTHESIA:   general and paracervical block Findings: no endometrial lesions noted. Tubal ostia seen EBL:  Total I/O In: 1000 [I.V.:1000] Out: -   BLOOD ADMINISTERED:none  DRAINS: none   LOCAL MEDICATIONS USED:  OTHER nesicaine  SPECIMEN:  No Specimen  DISPOSITION OF SPECIMEN:  N/A  COUNTS:  YES  TOURNIQUET:  * No tourniquets in log *  DICTATION: .Other Dictation: Dictation Number [id number]  PLAN OF CARE: Discharge to home after PACU  PATIENT DISPOSITION:  PACU - hemodynamically stable.   Delay start of Pharmacological VTE agent (>24hrs) due to surgical blood loss or risk of bleeding: no

## 2014-01-04 ENCOUNTER — Encounter (HOSPITAL_COMMUNITY): Payer: Self-pay | Admitting: Obstetrics and Gynecology

## 2014-01-08 ENCOUNTER — Ambulatory Visit (HOSPITAL_BASED_OUTPATIENT_CLINIC_OR_DEPARTMENT_OTHER)
Admission: RE | Admit: 2014-01-08 | Payer: BC Managed Care – PPO | Source: Ambulatory Visit | Admitting: Obstetrics and Gynecology

## 2014-01-08 ENCOUNTER — Encounter (HOSPITAL_BASED_OUTPATIENT_CLINIC_OR_DEPARTMENT_OTHER): Admission: RE | Payer: Self-pay | Source: Ambulatory Visit

## 2014-01-08 SURGERY — DILATATION & CURETTAGE/HYSTEROSCOPY WITH THERMACHOICE ABLATION
Anesthesia: Choice

## 2014-03-19 ENCOUNTER — Other Ambulatory Visit: Payer: Self-pay | Admitting: Family Medicine

## 2014-03-19 MED ORDER — DICLOFENAC SODIUM 75 MG PO TBEC: 75.0000 mg | DELAYED_RELEASE_TABLET | Freq: Two times a day (BID) | ORAL | Status: AC

## 2014-03-19 MED ORDER — AMITRIPTYLINE HCL 25 MG PO TABS: ORAL_TABLET | ORAL | Status: AC

## 2014-04-10 ENCOUNTER — Other Ambulatory Visit: Payer: Self-pay | Admitting: Family Medicine

## 2014-04-10 DIAGNOSIS — M8430XS Stress fracture, unspecified site, sequela: Secondary | ICD-10-CM

## 2015-04-30 IMAGING — CT CT TIBIA FIBULA *R* W/O CM
3 series · 16 of 33 positions shown, 19 images · non-contrast
Comparison: None.

CLINICAL DATA: Stress fracture of the right tibia.

EXAM:
CT OF THE RIGHT TIBIA FIBULA WITHOUT CONTRAST
TECHNIQUE: Axial images were obtained through the distal right lower leg
without contrast. Sagittal and coronal images were then
reconstructed.

[Series 5: left tib/fib std · axial · 0.27mm/px · z∈[-300,-110]mm · 8 of 90 slices shown, 10 images]
[im 7/90  soft-tissue]
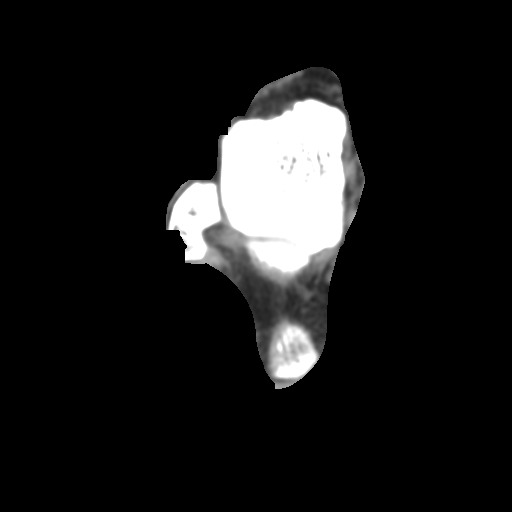
[im 7/90  bone]
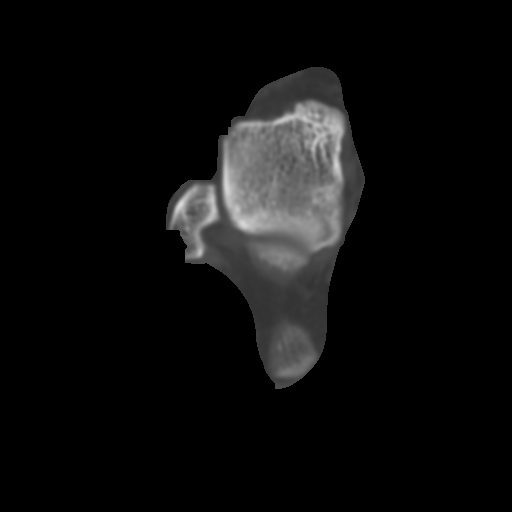
[im 21/90  bone]
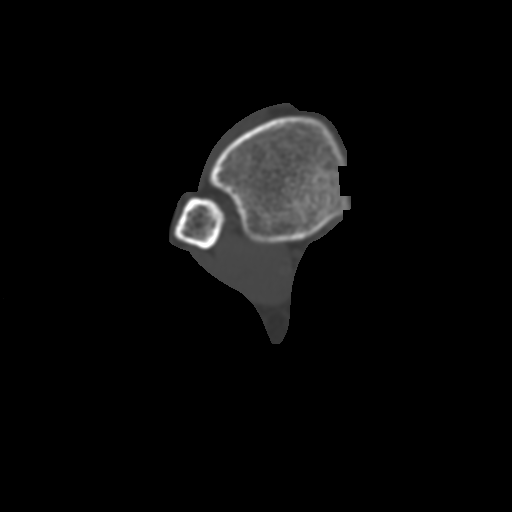
[im 28/90  bone]
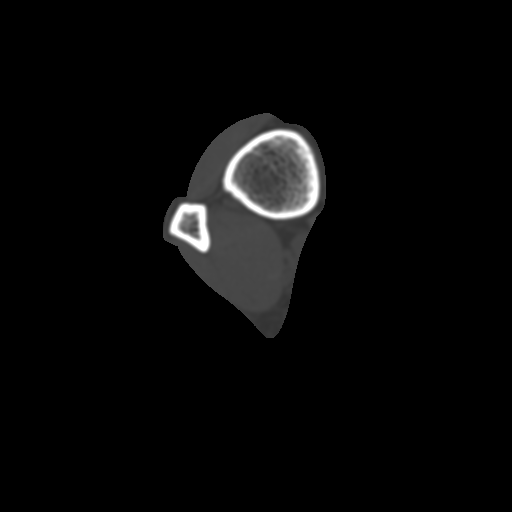
[im 42/90  bone]
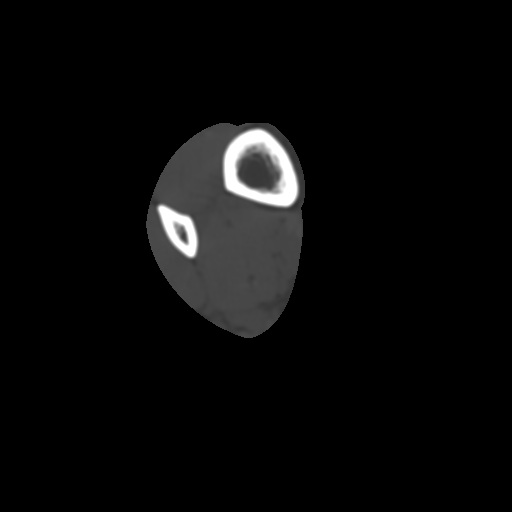
[im 48/90  soft-tissue]
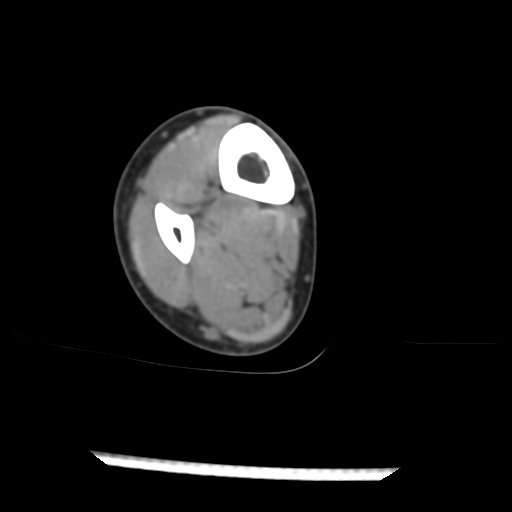
[im 48/90  bone]
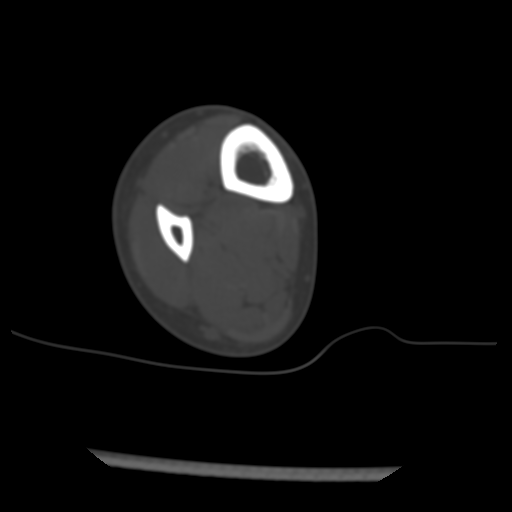
[im 62/90  bone]
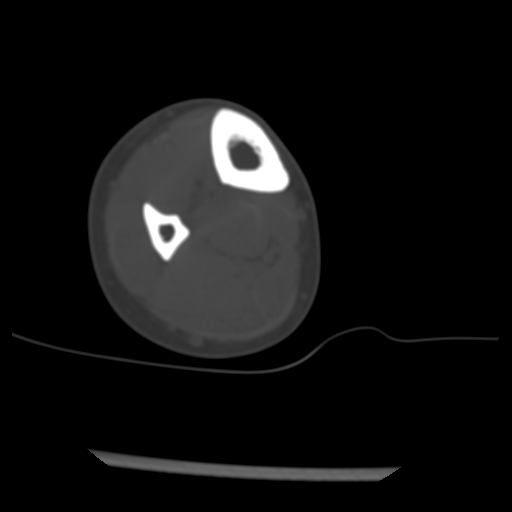
[im 69/90  bone]
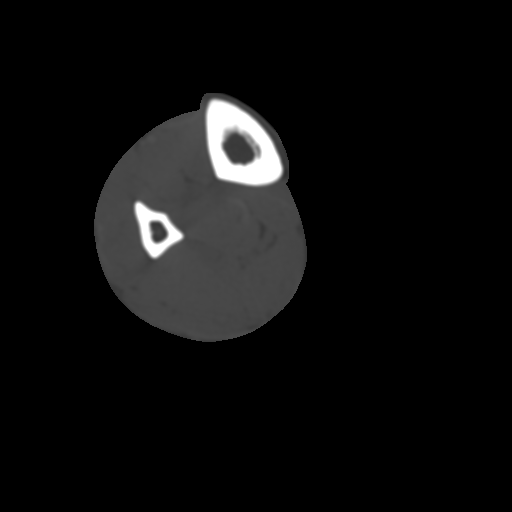
[im 83/90  bone]
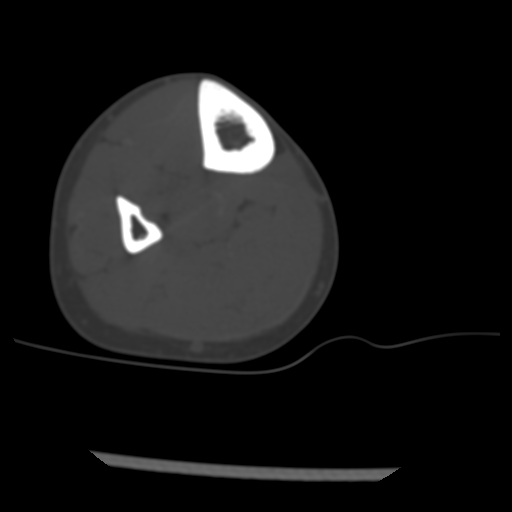

[Series 200: cor · coronal · 0.45mm/px · 3 of 41 slices shown]
[im 9/41  bone]
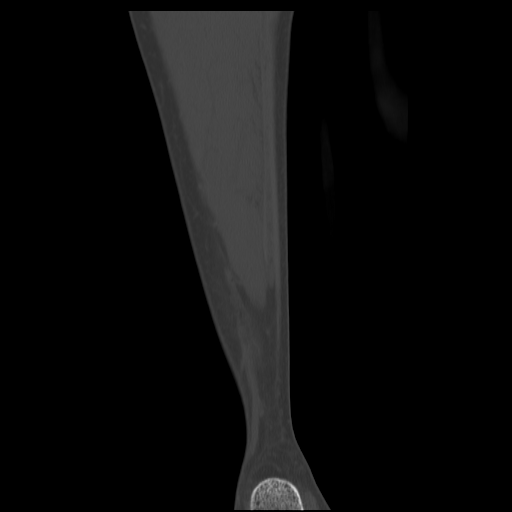
[im 17/41  bone]
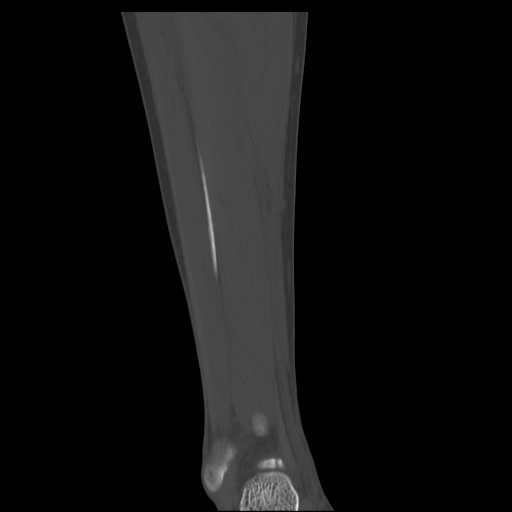
[im 25/41  bone]
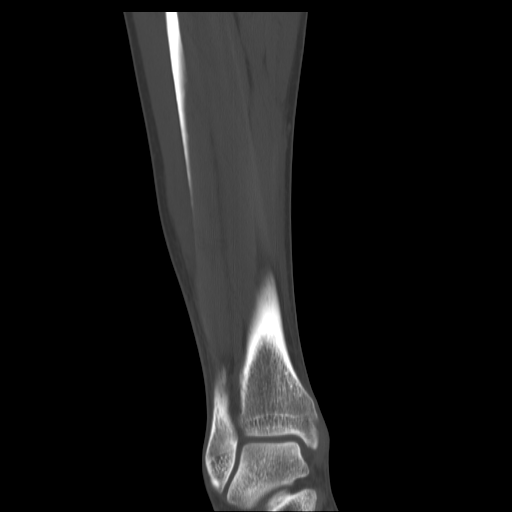

[Series 201: sag · sagittal · 0.45mm/px · 5 of 41 slices shown, 6 images]
[im 14/41  bone]
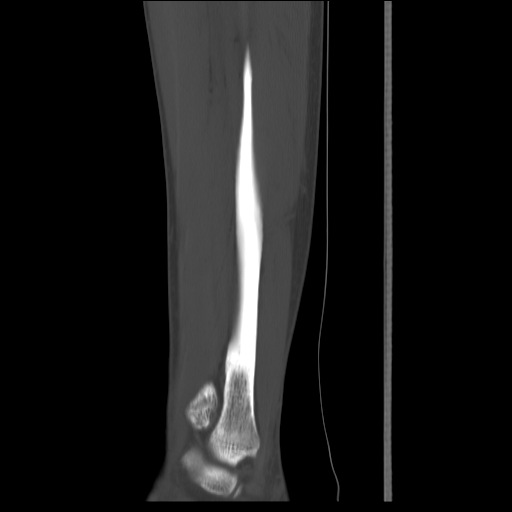
[im 17/41  bone]
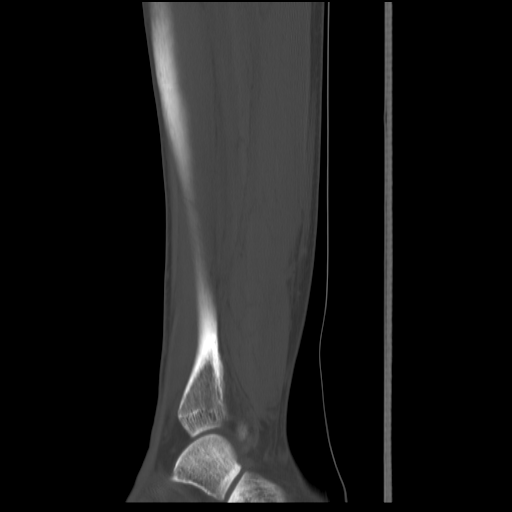
[im 21/41  soft-tissue]
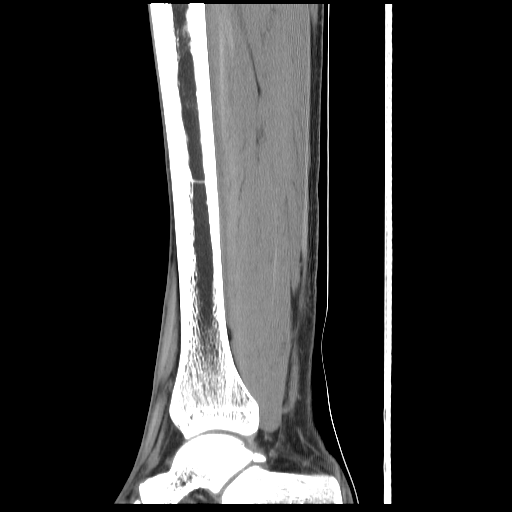
[im 21/41  bone]
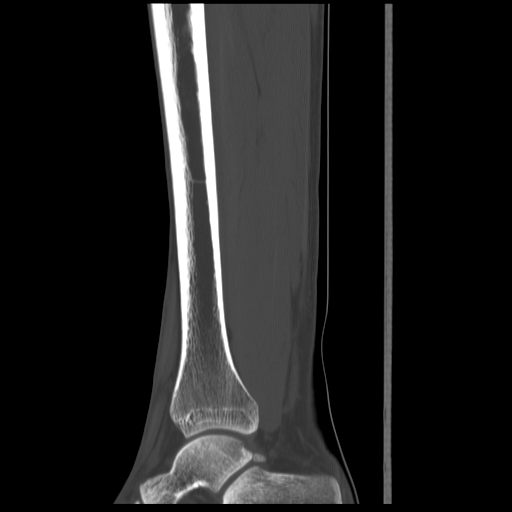
[im 24/41  bone]
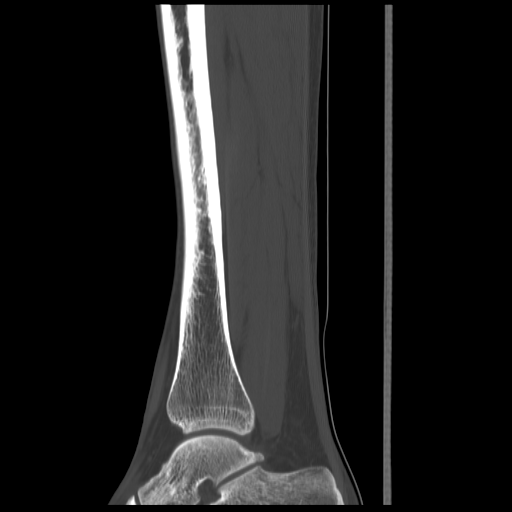
[im 27/41  bone]
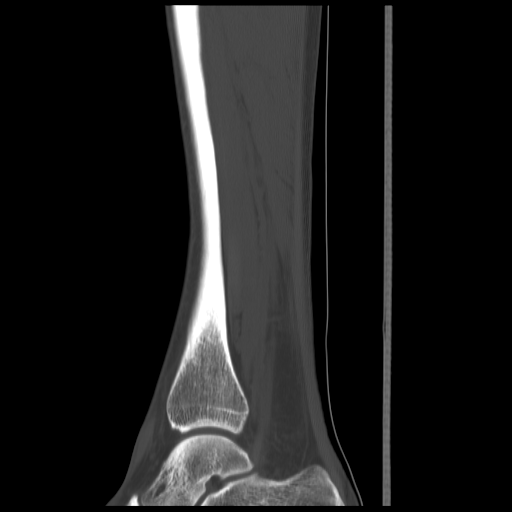

[16 of 33 positions shown; findings below may reference images not displayed]

FINDINGS: There is a focal area of cortical thickening of the medial aspect of
the mid tibial shaft 13 cm above the ankle joint. There is subtle
lucency immediately below the cortical surface at that site. This is
consistent with a stress reaction. There is also a linear lucency
through the anterior medial aspect of the cortex of the tibia at the
same level consistent with a tiny focal stress fracture.

The adjacent muscles and tendons appear normal. The visualized
portion of the fibula is normal. No ankle joint effusion or ankle
arthritis.
IMPRESSION: Subtle adjacent stress fracture/stress reactions of the mid tibia.
No periosteal reaction. The findings are quite subtle.

## 2016-01-28 ENCOUNTER — Other Ambulatory Visit: Payer: Self-pay | Admitting: Family Medicine
